# Patient Record
Sex: Male | Born: 1940 | Race: Black or African American | Hispanic: No | Smoking: Never smoker
Health system: Southern US, Community
[De-identification: ages and names within clinical notes are randomized; demographics above are authoritative.]

## PROBLEM LIST (undated history)

## (undated) DIAGNOSIS — E119 Type 2 diabetes mellitus without complications: Secondary | ICD-10-CM

## (undated) DIAGNOSIS — I10 Essential (primary) hypertension: Secondary | ICD-10-CM

## (undated) DIAGNOSIS — M1990 Unspecified osteoarthritis, unspecified site: Secondary | ICD-10-CM

## (undated) HISTORY — PX: COLONOSCOPY: SHX174

---

## 2004-09-28 ENCOUNTER — Emergency Department (HOSPITAL_COMMUNITY): Admission: EM | Admit: 2004-09-28 | Discharge: 2004-09-28 | Payer: Self-pay | Admitting: Emergency Medicine

## 2004-10-04 ENCOUNTER — Emergency Department (HOSPITAL_COMMUNITY): Admission: EM | Admit: 2004-10-04 | Discharge: 2004-10-05 | Payer: Self-pay | Admitting: Emergency Medicine

## 2007-06-30 ENCOUNTER — Emergency Department (HOSPITAL_COMMUNITY): Admission: EM | Admit: 2007-06-30 | Discharge: 2007-06-30 | Payer: Self-pay | Admitting: Emergency Medicine

## 2008-06-13 ENCOUNTER — Emergency Department (HOSPITAL_COMMUNITY): Admission: EM | Admit: 2008-06-13 | Discharge: 2008-06-13 | Payer: Self-pay | Admitting: Emergency Medicine

## 2008-06-21 ENCOUNTER — Encounter: Admission: RE | Admit: 2008-06-21 | Discharge: 2008-06-21 | Payer: Self-pay | Admitting: Chiropractic Medicine

## 2009-01-29 IMAGING — CR DG CHEST 2V
2 series · 2 of 2 positions shown · non-contrast
Comparison: 09/28/04.

CLINICAL DATA: Cough and cold symptoms.
 PA AND LATERAL CHEST - 2 VIEW:

[w chest pa]
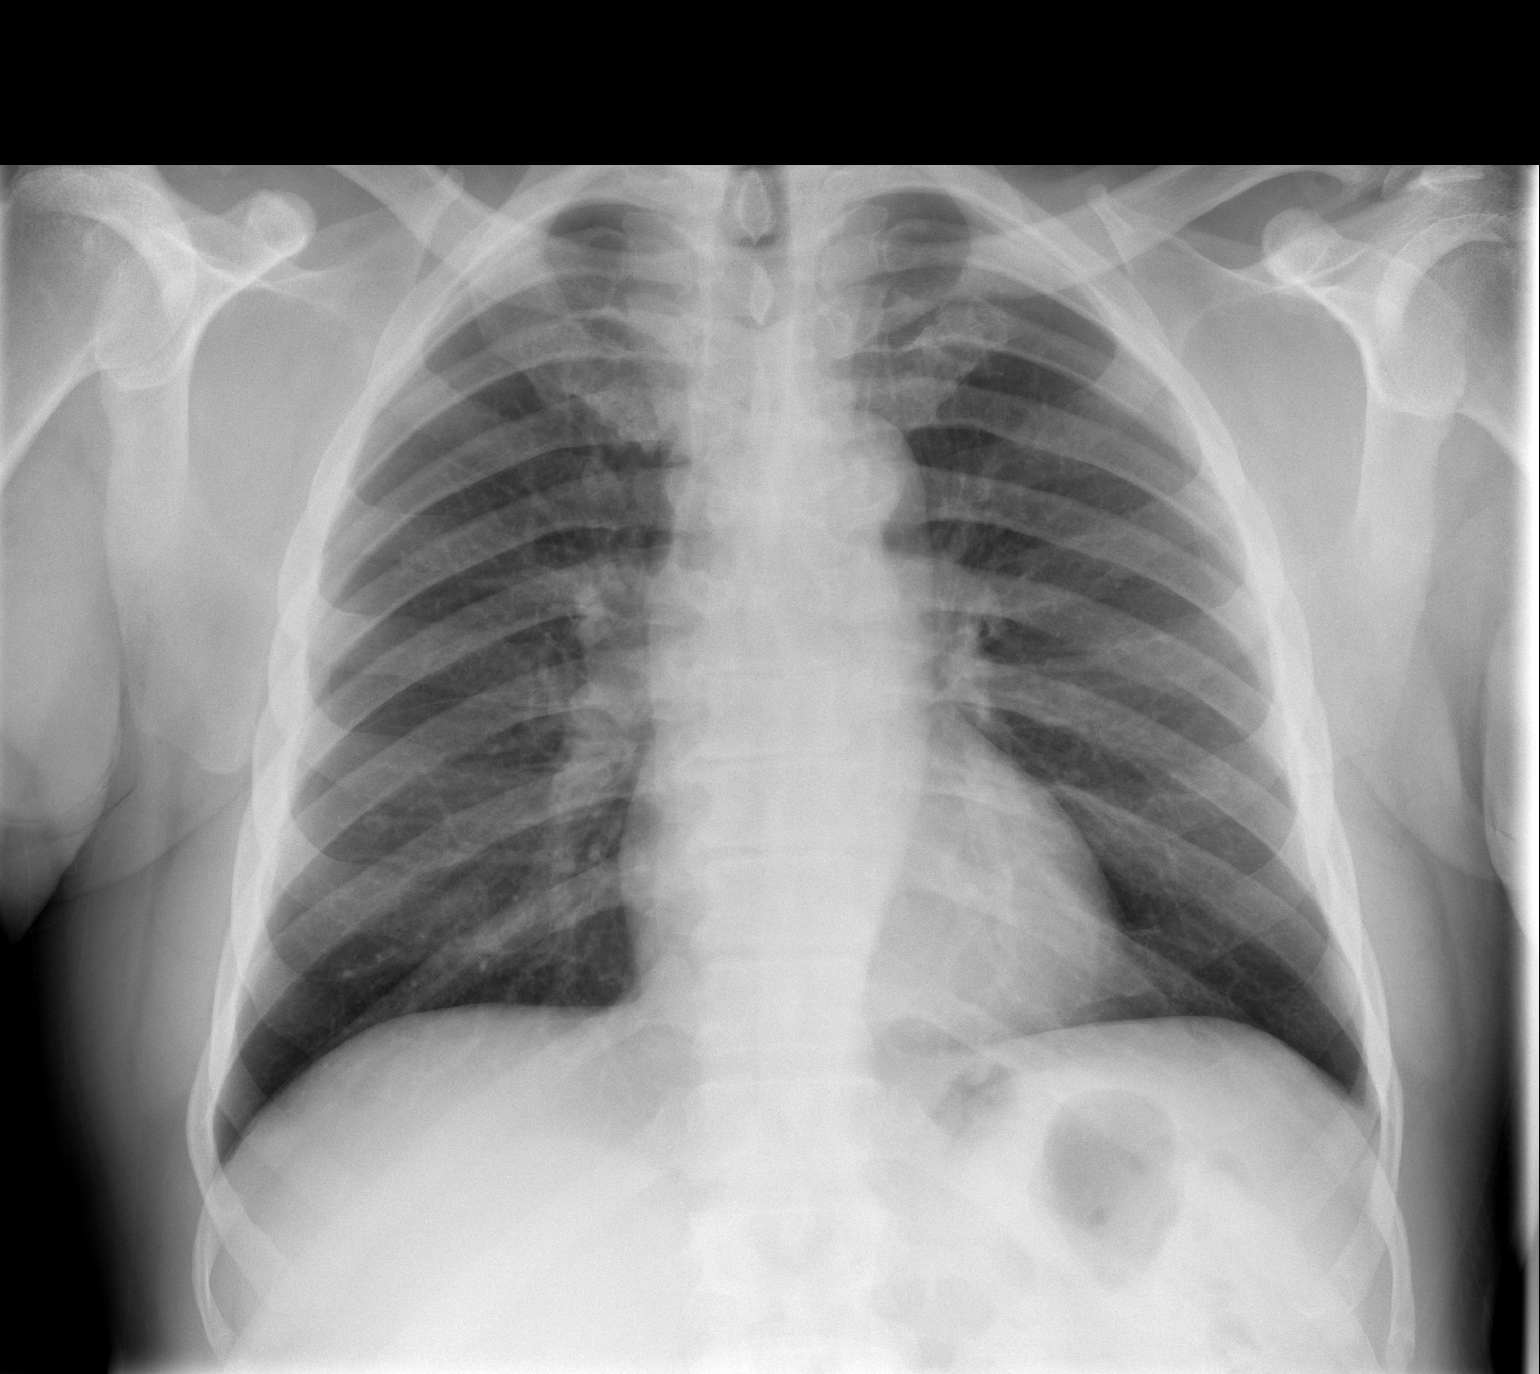

[w chest lat]
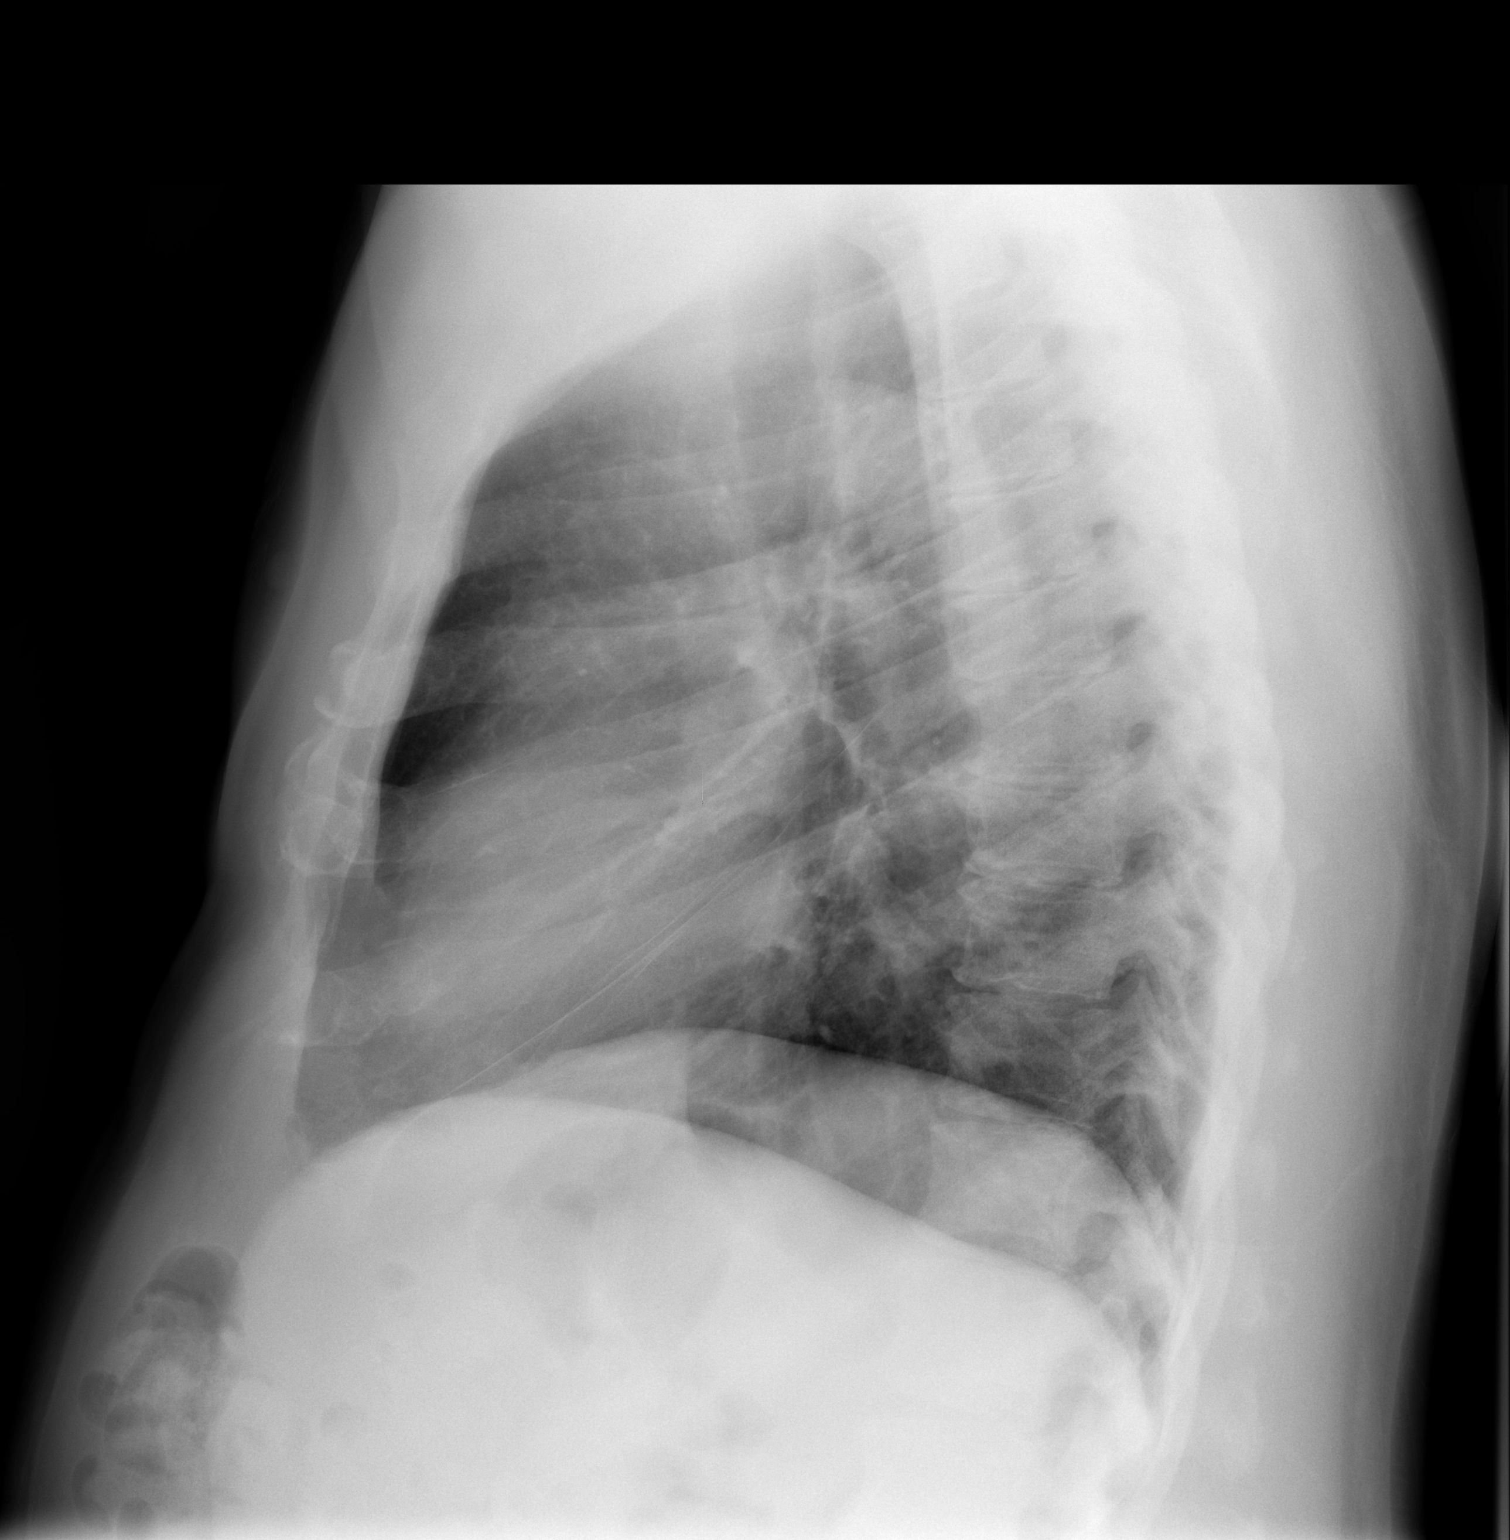

[2 of 2 positions shown; findings below may reference images not displayed]

FINDINGS: The lungs are clear.   Heart size is normal.   No effusion or focal bony abnormality.
IMPRESSION: No acute disease.

## 2010-01-13 IMAGING — CR DG SHOULDER 2+V*L*
3 series · 3 of 3 positions shown · non-contrast
Comparison: None

CLINICAL DATA: Left shoulder pain secondary to a fall 2 days ago.

LEFT SHOULDER - 2+ VIEW

[w shoulder ap internal left]
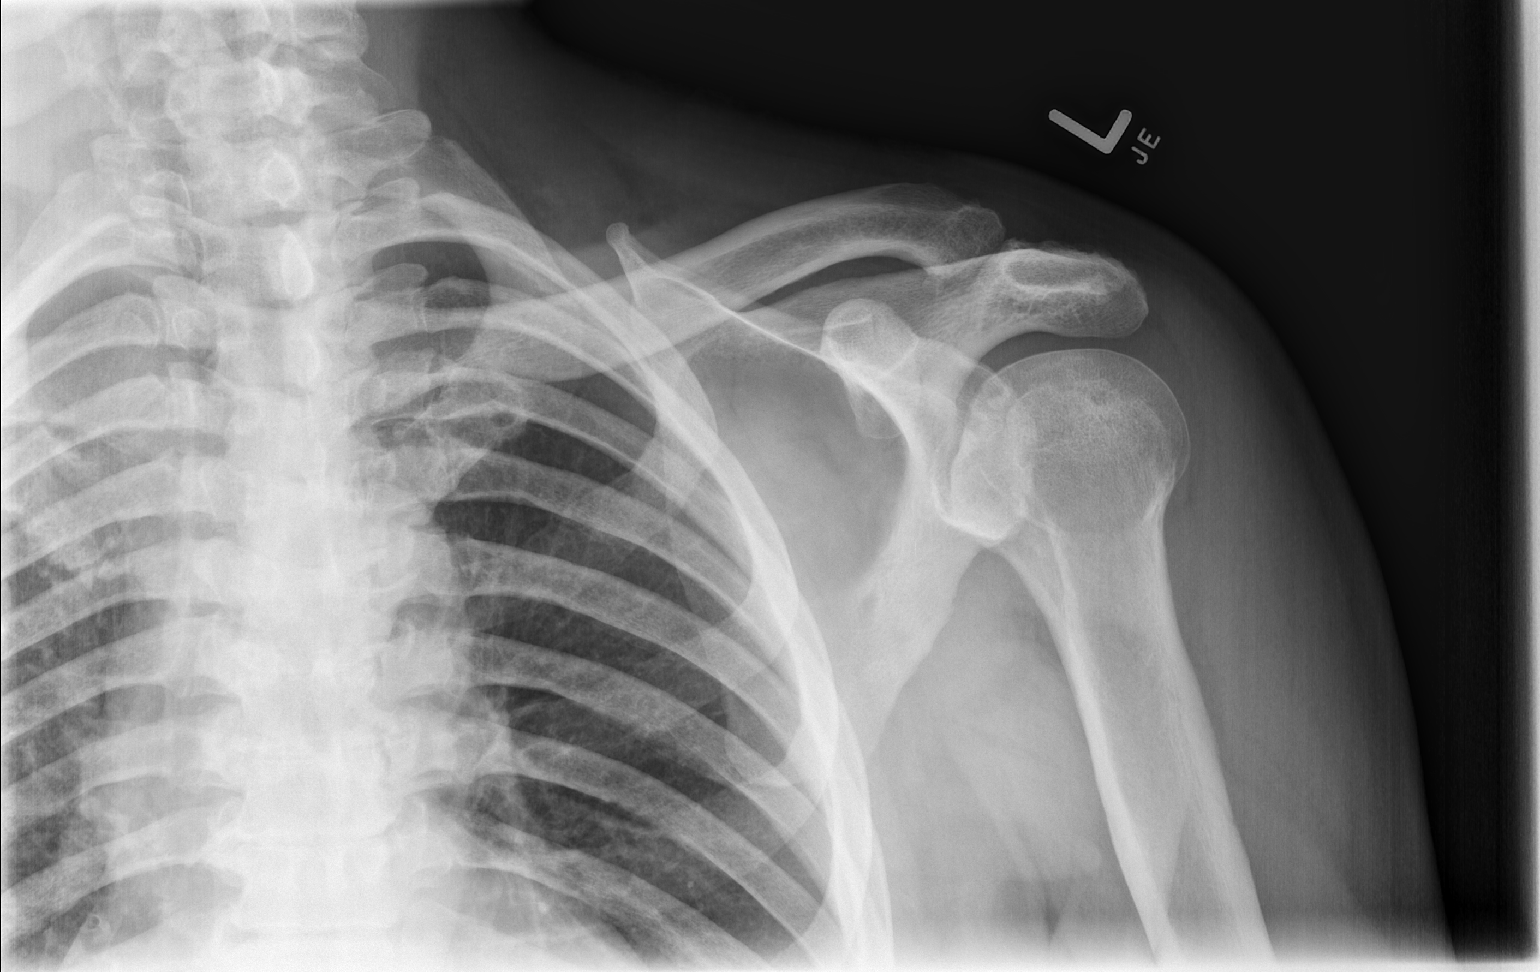

[w shoulder ap external left]
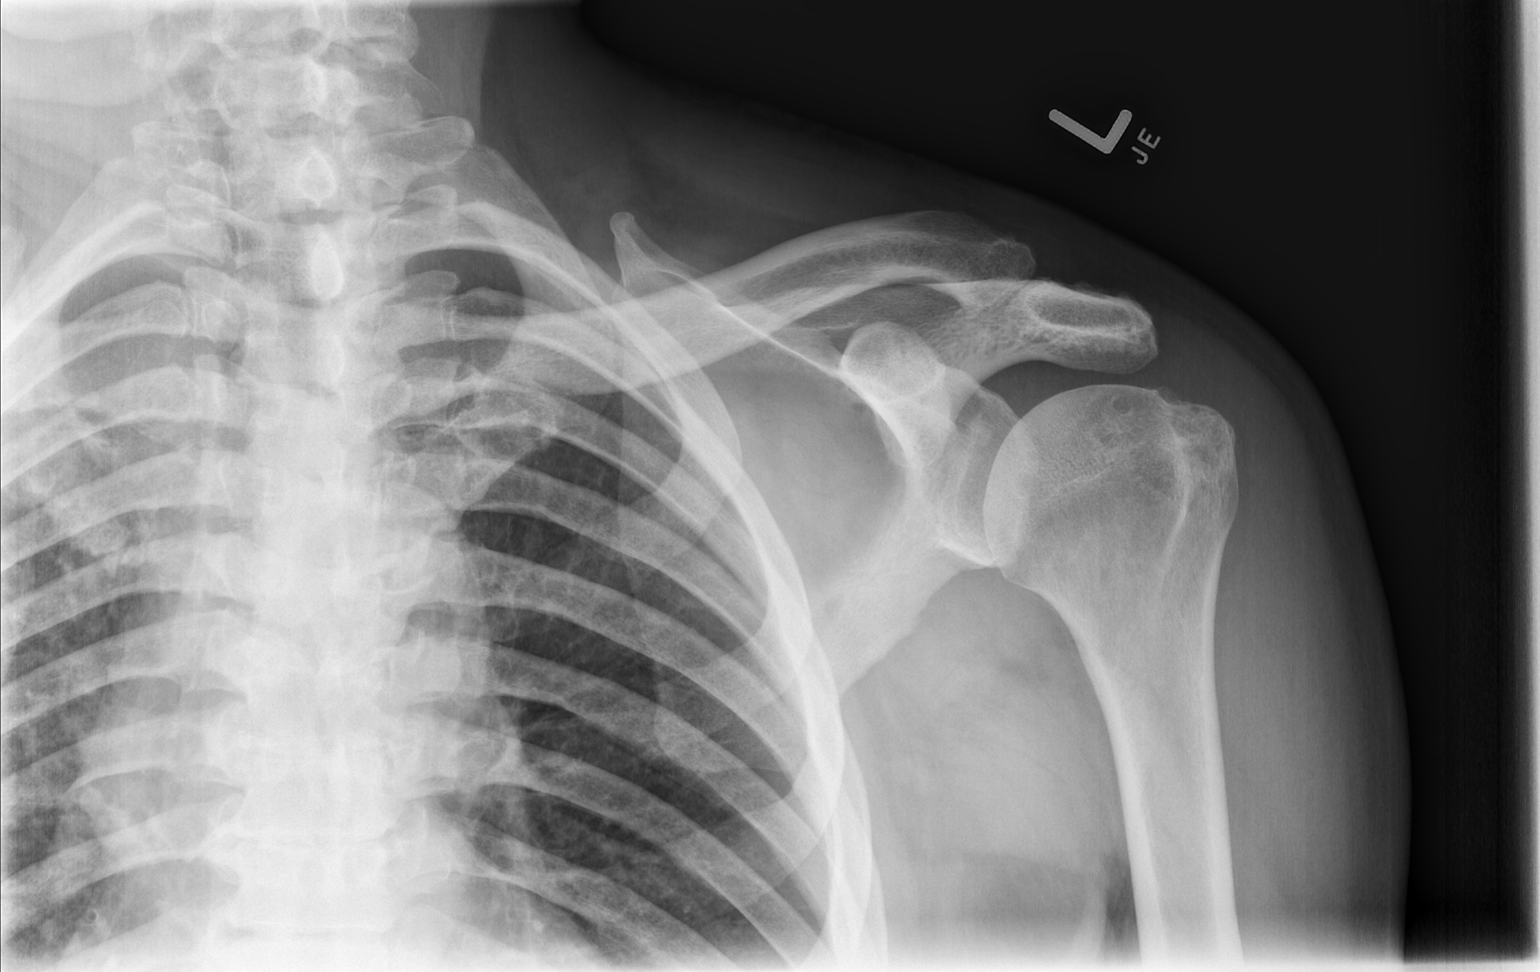

[x shoulder axillary left]
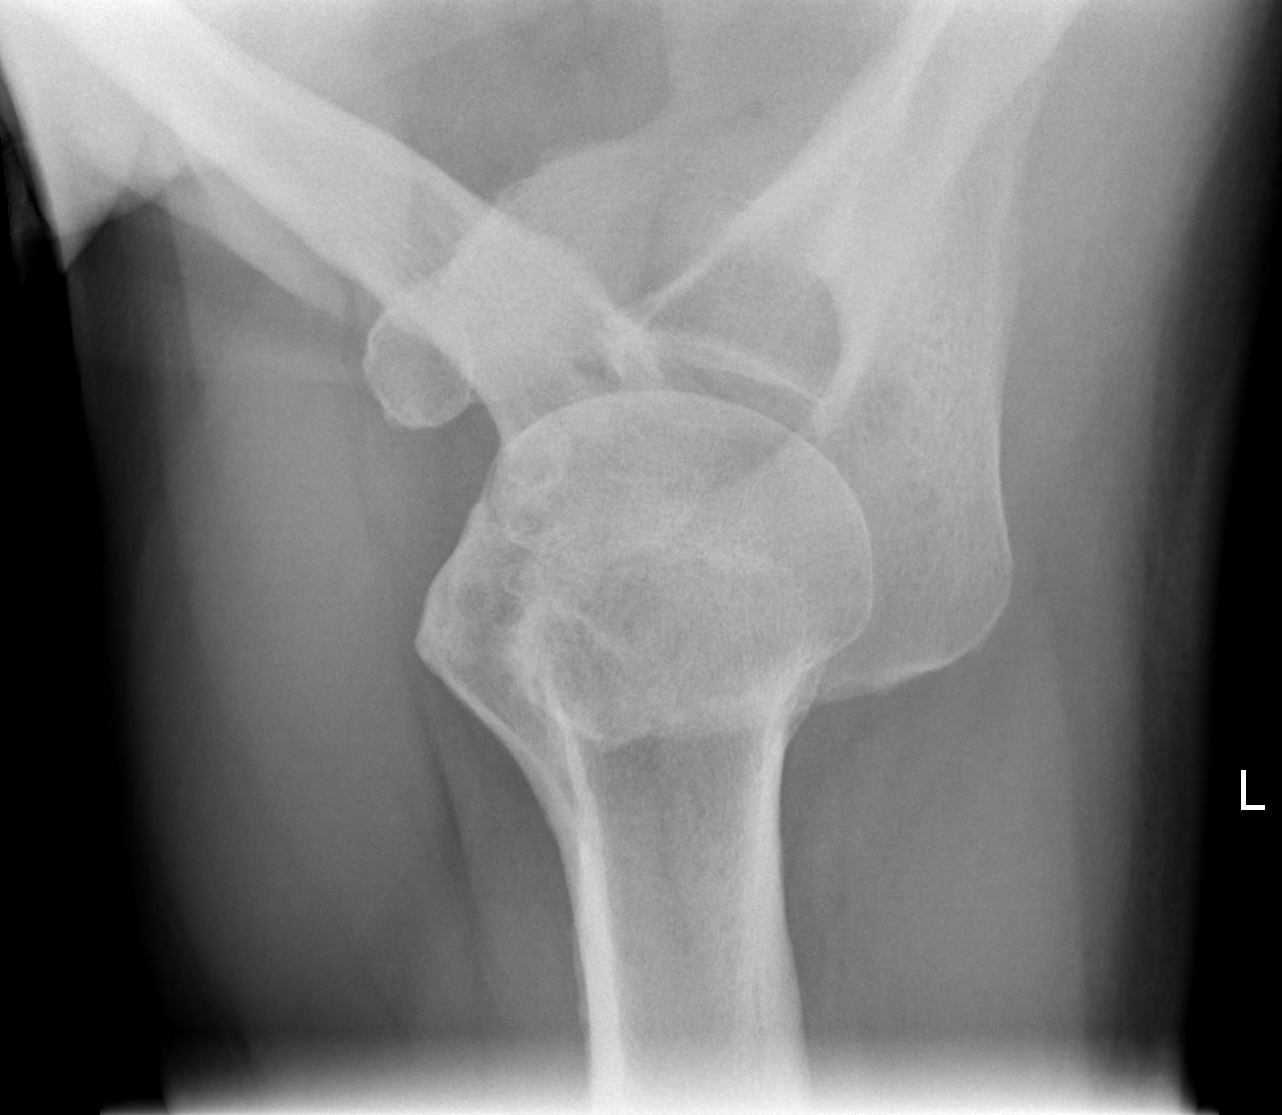

[3 of 3 positions shown; findings below may reference images not displayed]

FINDINGS: There are cystic degenerative changes of the humeral head
in the region of the greater tuberosity.  There are some
degenerative changes at the acromioclavicular joint as well.  There
is no fracture or dislocation or other acute abnormality.
IMPRESSION: Arthritic changes at the left shoulder joint.

## 2010-01-13 IMAGING — CR DG RIBS 2V*L*
4 series · 4 of 4 positions shown · non-contrast
Comparison: None

CLINICAL DATA: Left shoulder, left rib, and left hip pain secondary
to a fall 2 days ago.

LEFT RIBS - 2 VIEW

[w ribs ap/pa upper left]
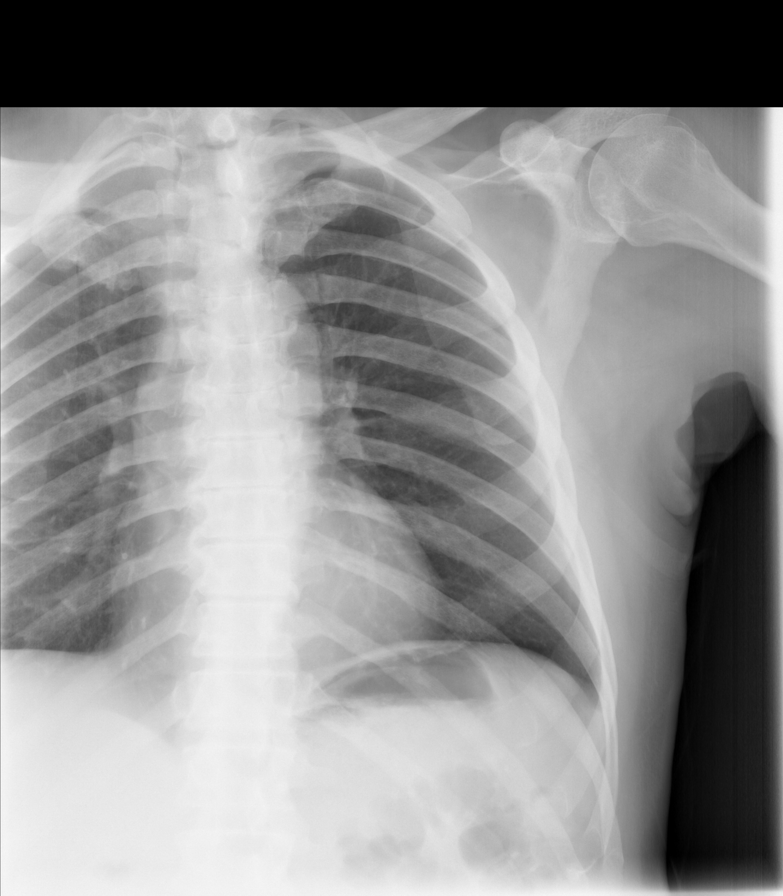

[w ribs ap/pa lower left]
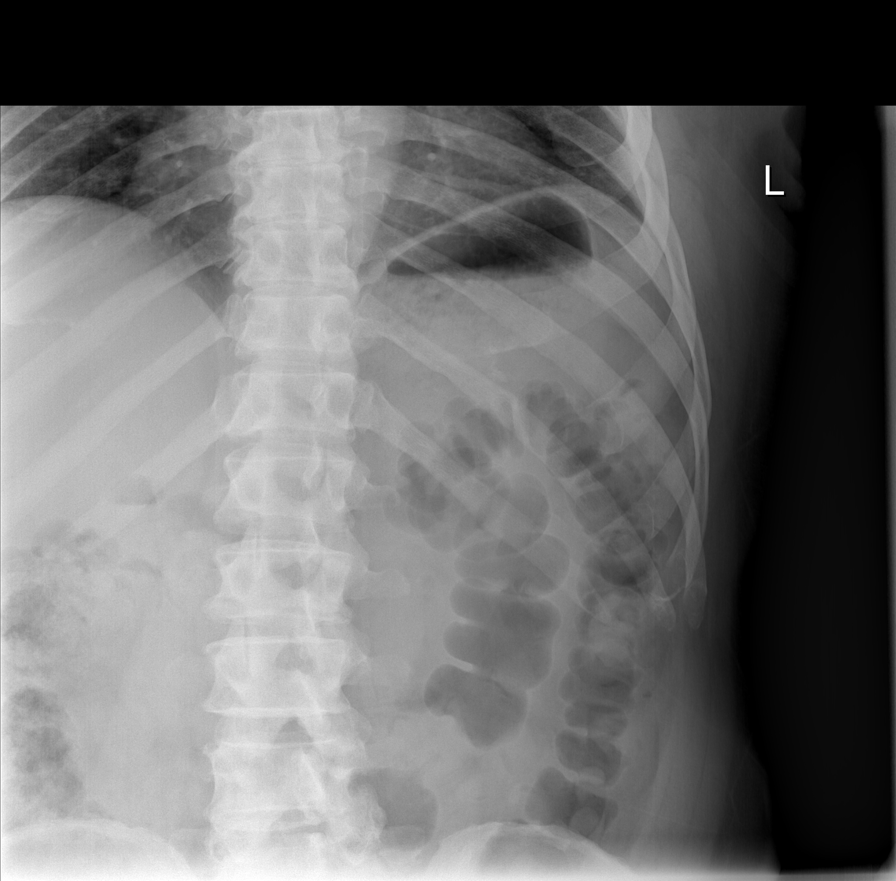

[w ribs oblique left (1 of 2)]
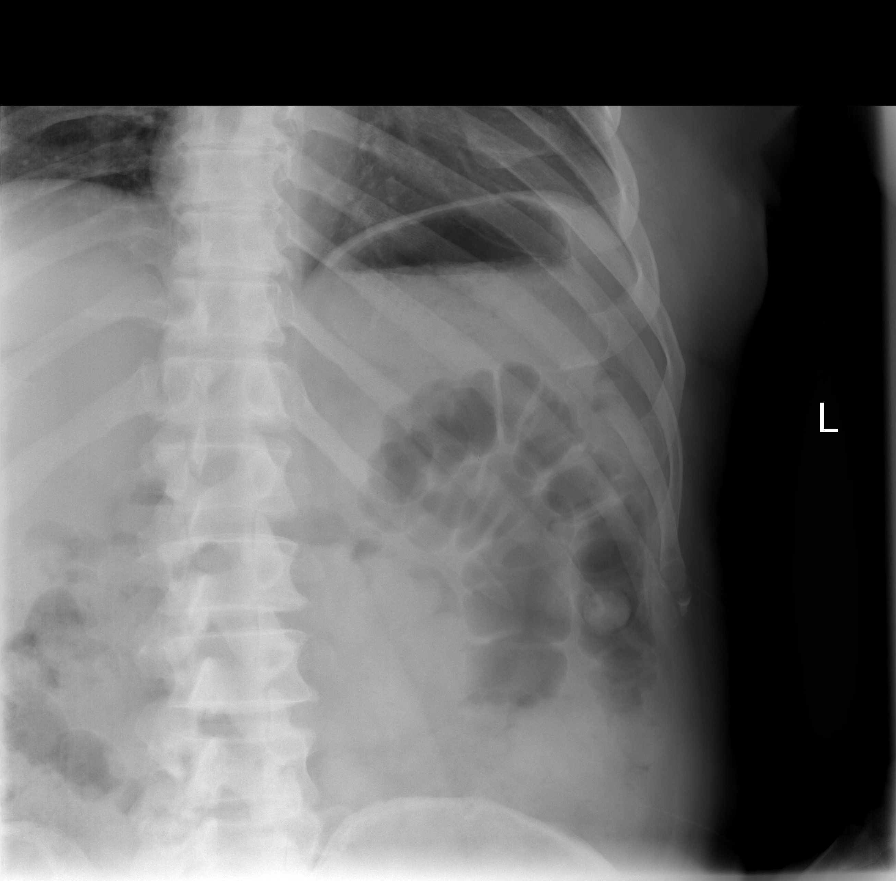

[w ribs oblique left (2 of 2)]
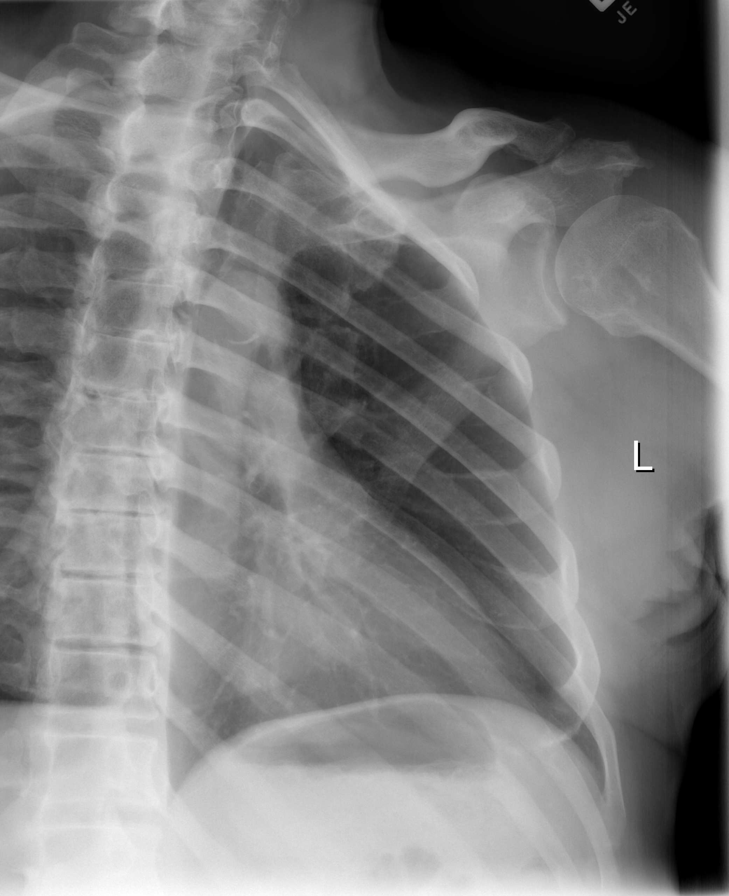

[4 of 4 positions shown; findings below may reference images not displayed]

FINDINGS: There is no fracture or other significant abnormality of
the left ribs.  There is no pneumothorax or lung contusion.
IMPRESSION: Normal left ribs.

## 2011-04-12 LAB — CBC
Hemoglobin: 13.8
MCHC: 34.9
MCV: 77.4 — ABNORMAL LOW
RDW: 14.1

## 2011-04-12 LAB — I-STAT 8, (EC8 V) (CONVERTED LAB)
Acid-Base Excess: 2
Bicarbonate: 26.8 — ABNORMAL HIGH
Glucose, Bld: 159 — ABNORMAL HIGH
Sodium: 139
TCO2: 28
pH, Ven: 7.411 — ABNORMAL HIGH

## 2011-04-12 LAB — DIFFERENTIAL
Basophils Absolute: 0
Basophils Relative: 0
Monocytes Relative: 12
Neutro Abs: 3.7

## 2011-04-12 LAB — POCT I-STAT CREATININE
Creatinine, Ser: 1.2
Operator id: 270651

## 2013-09-11 ENCOUNTER — Encounter (HOSPITAL_COMMUNITY): Payer: Self-pay | Admitting: Emergency Medicine

## 2013-09-11 ENCOUNTER — Emergency Department (HOSPITAL_COMMUNITY)
Admission: EM | Admit: 2013-09-11 | Discharge: 2013-09-11 | Disposition: A | Discharge status: Home / Self Care | Payer: Medicare Other | Attending: Emergency Medicine | Admitting: Emergency Medicine

## 2013-09-11 ENCOUNTER — Emergency Department (HOSPITAL_COMMUNITY): Payer: Medicare Other

## 2013-09-11 DIAGNOSIS — I10 Essential (primary) hypertension: Secondary | ICD-10-CM | POA: Insufficient documentation

## 2013-09-11 DIAGNOSIS — R109 Unspecified abdominal pain: Secondary | ICD-10-CM

## 2013-09-11 DIAGNOSIS — R1110 Vomiting, unspecified: Secondary | ICD-10-CM

## 2013-09-11 DIAGNOSIS — Z8739 Personal history of other diseases of the musculoskeletal system and connective tissue: Secondary | ICD-10-CM | POA: Insufficient documentation

## 2013-09-11 DIAGNOSIS — K5900 Constipation, unspecified: Secondary | ICD-10-CM

## 2013-09-11 DIAGNOSIS — E119 Type 2 diabetes mellitus without complications: Secondary | ICD-10-CM | POA: Insufficient documentation

## 2013-09-11 HISTORY — DX: Essential (primary) hypertension: I10

## 2013-09-11 HISTORY — DX: Unspecified osteoarthritis, unspecified site: M19.90

## 2013-09-11 HISTORY — DX: Type 2 diabetes mellitus without complications: E11.9

## 2013-09-11 LAB — CBC WITH DIFFERENTIAL/PLATELET
BASOS PCT: 0 % (ref 0–1)
Basophils Absolute: 0 10*3/uL (ref 0.0–0.1)
EOS ABS: 0 10*3/uL (ref 0.0–0.7)
EOS PCT: 0 % (ref 0–5)
HEMATOCRIT: 41.1 % (ref 39.0–52.0)
HEMOGLOBIN: 14 g/dL (ref 13.0–17.0)
LYMPHS PCT: 7 % — AB (ref 12–46)
Lymphs Abs: 0.6 10*3/uL — ABNORMAL LOW (ref 0.7–4.0)
MCH: 26.5 pg (ref 26.0–34.0)
MCHC: 34.1 g/dL (ref 30.0–36.0)
MCV: 77.8 fL — ABNORMAL LOW (ref 78.0–100.0)
MONO ABS: 0.2 10*3/uL (ref 0.1–1.0)
MONOS PCT: 2 % — AB (ref 3–12)
NEUTROS ABS: 7.4 10*3/uL (ref 1.7–7.7)
Neutrophils Relative %: 90 % — ABNORMAL HIGH (ref 43–77)
Platelets: 195 10*3/uL (ref 150–400)
RBC: 5.28 MIL/uL (ref 4.22–5.81)
RDW: 13.7 % (ref 11.5–15.5)
WBC: 8.2 10*3/uL (ref 4.0–10.5)

## 2013-09-11 LAB — URINALYSIS, ROUTINE W REFLEX MICROSCOPIC
Bilirubin Urine: NEGATIVE
GLUCOSE, UA: NEGATIVE mg/dL
Hgb urine dipstick: NEGATIVE
KETONES UR: 15 mg/dL — AB
LEUKOCYTES UA: NEGATIVE
NITRITE: NEGATIVE
PH: 5.5 (ref 5.0–8.0)
PROTEIN: NEGATIVE mg/dL
Specific Gravity, Urine: 1.033 — ABNORMAL HIGH (ref 1.005–1.030)
Urobilinogen, UA: 0.2 mg/dL (ref 0.0–1.0)

## 2013-09-11 LAB — COMPREHENSIVE METABOLIC PANEL
ALT: 12 U/L (ref 0–53)
AST: 14 U/L (ref 0–37)
Albumin: 4.3 g/dL (ref 3.5–5.2)
Alkaline Phosphatase: 92 U/L (ref 39–117)
BILIRUBIN TOTAL: 0.3 mg/dL (ref 0.3–1.2)
BUN: 11 mg/dL (ref 6–23)
CHLORIDE: 102 meq/L (ref 96–112)
CO2: 27 meq/L (ref 19–32)
CREATININE: 1.15 mg/dL (ref 0.50–1.35)
Calcium: 9.6 mg/dL (ref 8.4–10.5)
GFR calc Af Amer: 71 mL/min — ABNORMAL LOW (ref >90)
GFR, EST NON AFRICAN AMERICAN: 61 mL/min — AB (ref >90)
Glucose, Bld: 176 mg/dL — ABNORMAL HIGH (ref 70–99)
Potassium: 4.1 mEq/L (ref 3.7–5.3)
SODIUM: 142 meq/L (ref 137–147)
Total Protein: 7.9 g/dL (ref 6.0–8.3)

## 2013-09-11 LAB — LIPASE, BLOOD: Lipase: 50 U/L (ref 11–59)

## 2013-09-11 MED ORDER — SODIUM CHLORIDE 0.9 % IV BOLUS (SEPSIS)
1000.0000 mL | Freq: Once | INTRAVENOUS | Status: AC
  Administered 2013-09-11: 1000 mL via INTRAVENOUS

## 2013-09-11 MED ORDER — IOHEXOL 300 MG/ML  SOLN
100.0000 mL | Freq: Once | INTRAMUSCULAR | Status: AC | PRN
  Administered 2013-09-11: 100 mL via INTRAVENOUS

## 2013-09-11 MED ORDER — IOHEXOL 300 MG/ML  SOLN
25.0000 mL | INTRAMUSCULAR | Status: AC | PRN
  Administered 2013-09-11 (×2): 25 mL via ORAL

## 2013-09-11 MED ORDER — ONDANSETRON HCL 4 MG/2ML IJ SOLN
4.0000 mg | Freq: Once | INTRAMUSCULAR | Status: AC
  Administered 2013-09-11: 4 mg via INTRAVENOUS
  Filled 2013-09-11: qty 2

## 2013-09-11 MED ORDER — OXYCODONE-ACETAMINOPHEN 5-325 MG PO TABS: 1.0000 | ORAL_TABLET | ORAL | Status: DC | PRN

## 2013-09-11 MED ORDER — DOCUSATE SODIUM 100 MG PO CAPS: 100.0000 mg | ORAL_CAPSULE | Freq: Every day | ORAL | Status: DC

## 2013-09-11 MED ORDER — HYDROMORPHONE HCL PF 1 MG/ML IJ SOLN
0.5000 mg | Freq: Once | INTRAMUSCULAR | Status: AC
  Administered 2013-09-11: 0.5 mg via INTRAVENOUS
  Filled 2013-09-11: qty 1

## 2013-09-11 NOTE — ED Notes (Signed)
Pt presents to department for evaluation of diffuse abdominal pain, N/V and constipation. Ongoing x1 day. Denies urinary symptoms. Pt states 8/10 abdominal pain. Pt is alert and oriented x4.

## 2013-09-11 NOTE — ED Provider Notes (Signed)
CSN: [id number]     Arrival date & time 09/11/13  1212 History   First MD Initiated Contact with Patient 09/11/13 1221     Chief Complaint  Patient presents with  . Abdominal Pain  . Emesis     (Consider location/radiation/quality/duration/timing/severity/associated sxs/prior Treatment) Patient is a 73 y.o. male presenting with abdominal pain and vomiting. The history is provided by the patient.  Abdominal Pain Pain location: upper abdomen. Pain quality: aching   Pain radiates to:  Does not radiate Pain severity:  Moderate Onset quality:  Sudden Duration:  10 hours Timing:  Constant Progression:  Worsening Chronicity:  New Context comment:  Ate ice cream and cupcakes after dinner last night Relieved by:  Nothing Worsened by:  Nothing tried Ineffective treatments:  None tried Associated symptoms: vomiting   Associated symptoms: no chest pain, no cough, no diarrhea, no dysuria, no fever, no hematuria, no nausea and no shortness of breath   Vomiting:    Quality:  Stomach contents Emesis Associated symptoms: abdominal pain   Associated symptoms: no diarrhea and no headaches     Past Medical History  Diagnosis Date  . Hypertension   . Diabetes mellitus without complication   . Arthritis    History reviewed. No pertinent past surgical history. No family history on file. History  Substance Use Topics  . Smoking status: Never Smoker   . Smokeless tobacco: Not on file  . Alcohol Use: No    Review of Systems  Constitutional: Negative for fever.  HENT: Negative for drooling and rhinorrhea.   Eyes: Negative for pain.  Respiratory: Negative for cough and shortness of breath.   Cardiovascular: Negative for chest pain and leg swelling.  Gastrointestinal: Positive for vomiting and abdominal pain. Negative for nausea and diarrhea.  Genitourinary: Negative for dysuria and hematuria.  Musculoskeletal: Negative for gait problem and neck pain.  Skin: Negative for color change.   Neurological: Negative for numbness and headaches.  Hematological: Negative for adenopathy.  Psychiatric/Behavioral: Negative for behavioral problems.  All other systems reviewed and are negative.      Allergies  Review of patient's allergies indicates no known allergies.  Home Medications  No current outpatient prescriptions on file. BP 189/76  Pulse 63  Temp(Src) 97.6 F (36.4 C) (Oral)  Resp 18  SpO2 96% Physical Exam  Nursing note and vitals reviewed. Constitutional: He is oriented to person, place, and time. He appears well-developed and well-nourished.  HENT:  Head: Normocephalic and atraumatic.  Right Ear: External ear normal.  Left Ear: External ear normal.  Nose: Nose normal.  Mouth/Throat: Oropharynx is clear and moist. No oropharyngeal exudate.  Eyes: Conjunctivae and EOM are normal. Pupils are equal, round, and reactive to light.  Neck: Normal range of motion. Neck supple.  Cardiovascular: Normal rate, regular rhythm, normal heart sounds and intact distal pulses.  Exam reveals no gallop and no friction rub.   No murmur heard. Pulmonary/Chest: Effort normal and breath sounds normal. No respiratory distress. He has no wheezes.  Abdominal: Soft. Bowel sounds are normal. He exhibits no distension. There is tenderness. There is no rebound and no guarding.  Musculoskeletal: Normal range of motion. He exhibits no edema and no tenderness.  Neurological: He is alert and oriented to person, place, and time.  Skin: Skin is warm and dry. Erythema: mild tenderness to palpation of the upper abdomen which seems to be worse in the right upper quadrant and epigastric area.  Psychiatric: He has a normal mood and affect. His  behavior is normal.    ED Course  Procedures (including critical care time) Labs Review Labs Reviewed  CBC WITH DIFFERENTIAL - Abnormal; Notable for the following:    MCV 77.8 (*)    Neutrophils Relative % 90 (*)    Lymphocytes Relative 7 (*)    Lymphs  Abs 0.6 (*)    Monocytes Relative 2 (*)    All other components within normal limits  COMPREHENSIVE METABOLIC PANEL - Abnormal; Notable for the following:    Glucose, Bld 176 (*)    GFR calc non Af Amer 61 (*)    GFR calc Af Amer 71 (*)    All other components within normal limits  URINALYSIS, ROUTINE W REFLEX MICROSCOPIC - Abnormal; Notable for the following:    Specific Gravity, Urine 1.033 (*)    Ketones, ur 15 (*)    All other components within normal limits  LIPASE, BLOOD   Imaging Review Ct Abdomen Pelvis W Contrast  09/11/2013   CLINICAL DATA:  Diffuse abdominal pain, nausea vomiting and constipation for the preceding 24 hr  EXAM: CT ABDOMEN AND PELVIS WITH CONTRAST  TECHNIQUE: Multidetector CT imaging of the abdomen and pelvis was performed using the standard protocol following bolus administration of intravenous contrast.  CONTRAST:  OMNIPAQUE IOHEXOL 300 MG/ML SOLN semi: The patient also received oral contrast material  COMPARISON:  None.  FINDINGS: The liver exhibits no focal mass nor ductal dilation. The gallbladder is mildly distended and exhibits no evidence of stones or wall thickening or pericholecystic fluid. The pancreas, spleen, adrenal glands, and kidneys are normal in appearance. The caliber of the abdominal aorta is normal. The psoas musculature is normal in appearance. The partially distended urinary bladder exhibits a prominent impression upon its base from the adjacent enlarged prostate gland. No bladder stones or other abnormalities are demonstrated. On delayed images contrast within the renal collecting systems and proximal ureters is normal.  The stomach is moderately distended with the orally administered contrast. There is a hiatal hernia with thickened walls which may reflect inflammation. The duodenum is partially gas-filled. No ulcer is demonstrated. The partially contrast filled loops of small bowel exhibit no evidence of enteritis or obstruction or ileus. The  terminal ileum is normal in appearance. A normal calibered, uninflamed-appearing appendix is demonstrated on images 48 through 60. There is a moderate stool burden within the ascending and transverse portions of the colon. The descending colon and rectosigmoid demonstrate less stool volume. There is no evidence of colitis or diverticulitis. There is no inguinal nor significant umbilical hernia.  The lumbar vertebral bodies are preserved in height. The bony pelvis exhibits degenerative changes of the hips especially on the right but no acute abnormality. The lung bases exhibit minimal compressive atelectasis.  IMPRESSION: 1. There is thickening walls of the small hiatal hernial which may reflect esophagitis. The stomach and duodenum exhibit no acute abnormalities. 2. The remainder of the small bowel as well as the large bowel exhibit no acute abnormalities. There is a moderate stool burden in the right colon and in the transverse colon. No obstruction is demonstrated. 3. There is mild distention of the gallbladder which may reflect the patient's presumed fasting state but correlation with the patient's clinical exam and laboratory values is needed. Gallbladder ultrasound may be useful. 4. There is no evidence of acute urinary tract abnormality. There is prostatic enlargement.   Electronically Signed   By: David  [place]   On: 09/11/2013 14:00   US Abdomen Limited Ruq  09/11/2013   CLINICAL DATA:  Right upper quadrant pain.  EXAM: US ABDOMEN LIMITED - RIGHT UPPER QUADRANT  COMPARISON:  CT ABD/PELVIS W CM dated 09/11/2013  FINDINGS: Gallbladder:  Gallbladder is mildly distended but no evidence for gallstones, sludge or wall thickening.  Common bile duct:  Diameter: 0.5 cm.  Liver:  Normal appearance of the liver without focal abnormality. Main portal vein is patent.  IMPRESSION: Normal right upper quadrant ultrasound.   Electronically Signed   By: Richarda OverlieAdam  Henn M.D.   On: 09/11/2013 15:56     EKG Interpretation None       MDM   Final diagnoses:  Constipation  Abdominal pain  Vomiting    12:28 PM 73 y.o. male who presents with abdominal pain which began this morning approximately 1 AM. He states that he ate cupcakes and ice cream after dinner last night. He woke up early this morning with diffuse abdominal pain and had 2 episodes of emesis. He denies any fevers or diarrhea. He has not had a bowel movement since his symptoms started. He is afebrile and vital signs are unremarkable with the exception of mild hypertension. Will get screening labwork, pain control, and CT scan.  4:14 PM: Labs non-contrib. CT w/ mild distension of GB. Got US which was neg for acute findings. Pt's pain controlled, he continues to appear well.   I have discussed the diagnosis/risks/treatment options with the patient and believe the pt to be eligible for discharge home to follow-up with pcp as needed. We also discussed returning to the ED immediately if new or worsening sx occur. We discussed the sx which are most concerning (e.g., worsening pain, fever, inc vomiting) that necessitate immediate return. Medications administered to the patient during their visit and any new prescriptions provided to the patient are listed below.  Medications given during this visit Medications  sodium chloride 0.9 % bolus 1,000 mL (0 mLs Intravenous Stopped 09/11/13 1513)  HYDROmorphone (DILAUDID) injection 0.5 mg (0.5 mg Intravenous Given 09/11/13 1254)  ondansetron (ZOFRAN) injection 4 mg (4 mg Intravenous Given 09/11/13 1252)  iohexol (OMNIPAQUE) 300 MG/ML solution 25 mL (25 mLs Oral Contrast Given 09/11/13 1300)  iohexol (OMNIPAQUE) 300 MG/ML solution 100 mL (100 mLs Intravenous Contrast Given 09/11/13 1336)    New Prescriptions   DOCUSATE SODIUM (COLACE) 100 MG CAPSULE    Take 1 capsule (100 mg total) by mouth daily.   OXYCODONE-ACETAMINOPHEN (PERCOCET) 5-325 MG PER TABLET    Take 1 tablet by mouth every 4 (four) hours as needed.     Junius ArgyleForrest S  Adarsh Mundorf, MD 09/11/13 [phone]

## 2013-09-11 NOTE — Discharge Instructions (Signed)
Abdominal Pain, Adult °Many things can cause abdominal pain. Usually, abdominal pain is not caused by a disease and will improve without treatment. It can often be observed and treated at home. Your health care provider will do a physical exam and possibly order blood tests and X-rays to help determine the seriousness of your pain. However, in many cases, more time must pass before a clear cause of the pain can be found. Before that point, your health care provider may not know if you need more testing or further treatment. °HOME CARE INSTRUCTIONS  °Monitor your abdominal pain for any changes. The following actions may help to alleviate any discomfort you are experiencing: °· Only take over-the-counter or prescription medicines as directed by your health care provider. °· Do not take laxatives unless directed to do so by your health care provider. °· Try a clear liquid diet (broth, tea, or water) as directed by your health care provider. Slowly move to a bland diet as tolerated. °SEEK MEDICAL CARE IF: °· You have unexplained abdominal pain. °· You have abdominal pain associated with nausea or diarrhea. °· You have pain when you urinate or have a bowel movement. °· You experience abdominal pain that wakes you in the night. °· You have abdominal pain that is worsened or improved by eating food. °· You have abdominal pain that is worsened with eating fatty foods. °SEEK IMMEDIATE MEDICAL CARE IF:  °· Your pain does not go away within 2 hours. °· You have a fever. °· You keep throwing up (vomiting). °· Your pain is felt only in portions of the abdomen, such as the right side or the left lower portion of the abdomen. °· You pass bloody or black tarry stools. °MAKE SURE YOU: °· Understand these instructions.   °· Will watch your condition.   °· Will get help right away if you are not doing well or get worse.   °Document Released: 04/03/2005 Document Revised: 04/14/2013 Document Reviewed: 03/03/2013 °Exit[employer]® Patient  Information ©2014 [employer]. ° °

## 2013-09-11 NOTE — ED Notes (Signed)
Patient transported to CT 

## 2014-09-16 ENCOUNTER — Encounter: Payer: Self-pay | Admitting: Podiatry

## 2014-09-16 ENCOUNTER — Ambulatory Visit (INDEPENDENT_AMBULATORY_CARE_PROVIDER_SITE_OTHER): Payer: Medicare Other

## 2014-09-16 ENCOUNTER — Ambulatory Visit (INDEPENDENT_AMBULATORY_CARE_PROVIDER_SITE_OTHER): Payer: Medicare Other | Admitting: Podiatry

## 2014-09-16 VITALS — BP 116/62 | HR 70 | Resp 12

## 2014-09-16 DIAGNOSIS — M2040 Other hammer toe(s) (acquired), unspecified foot: Secondary | ICD-10-CM | POA: Diagnosis not present

## 2014-09-16 DIAGNOSIS — M79676 Pain in unspecified toe(s): Secondary | ICD-10-CM

## 2014-09-16 DIAGNOSIS — E1140 Type 2 diabetes mellitus with diabetic neuropathy, unspecified: Secondary | ICD-10-CM | POA: Diagnosis not present

## 2014-09-16 DIAGNOSIS — M79672 Pain in left foot: Secondary | ICD-10-CM

## 2014-09-16 DIAGNOSIS — B351 Tinea unguium: Secondary | ICD-10-CM | POA: Diagnosis not present

## 2014-09-16 DIAGNOSIS — E1149 Type 2 diabetes mellitus with other diabetic neurological complication: Secondary | ICD-10-CM

## 2014-09-16 NOTE — Patient Instructions (Signed)
Diabetes and Foot Care Diabetes may cause you to have problems because of poor blood supply (circulation) to your feet and legs. This may cause the skin on your feet to become thinner, break easier, and heal more slowly. Your skin may become dry, and the skin may peel and crack. You may also have nerve damage in your legs and feet causing decreased feeling in them. You may not notice minor injuries to your feet that could lead to infections or more serious problems. Taking care of your feet is one of the most important things you can do for yourself.  HOME CARE INSTRUCTIONS  Wear shoes at all times, even in the house. Do not go barefoot. Bare feet are easily injured.  Check your feet daily for blisters, cuts, and redness. If you cannot see the bottom of your feet, use a mirror or ask someone for help.  Wash your feet with warm water (do not use hot water) and mild soap. Then pat your feet and the areas between your toes until they are completely dry. Do not soak your feet as this can dry your skin.  Apply a moisturizing lotion or petroleum jelly (that does not contain alcohol and is unscented) to the skin on your feet and to dry, brittle toenails. Do not apply lotion between your toes.  Trim your toenails straight across. Do not dig under them or around the cuticle. File the edges of your nails with an emery board or nail file.  Do not cut corns or calluses or try to remove them with medicine.  Wear clean socks or stockings every day. Make sure they are not too tight. Do not wear knee-high stockings since they may decrease blood flow to your legs.  Wear shoes that fit properly and have enough cushioning. To break in new shoes, wear them for just a few hours a day. This prevents you from injuring your feet. Always look in your shoes before you put them on to be sure there are no objects inside.  Do not cross your legs. This may decrease the blood flow to your feet.  If you find a minor scrape,  cut, or break in the skin on your feet, keep it and the skin around it clean and dry. These areas may be cleansed with mild soap and water. Do not cleanse the area with peroxide, alcohol, or iodine.  When you remove an adhesive bandage, be sure not to damage the skin around it.  If you have a wound, look at it several times a day to make sure it is healing.  Do not use heating pads or hot water bottles. They may burn your skin. If you have lost feeling in your feet or legs, you may not know it is happening until it is too late.  Make sure your health care provider performs a complete foot exam at least annually or more often if you have foot problems. Report any cuts, sores, or bruises to your health care provider immediately. SEEK MEDICAL CARE IF:   You have an injury that is not healing.  You have cuts or breaks in the skin.  You have an ingrown nail.  You notice redness on your legs or feet.  You feel burning or tingling in your legs or feet.  You have pain or cramps in your legs and feet.  Your legs or feet are numb.  Your feet always feel cold. SEEK IMMEDIATE MEDICAL CARE IF:   There is increasing redness,   swelling, or pain in or around a wound.  There is a red line that goes up your leg.  Pus is coming from a wound.  You develop a fever or as directed by your health care provider.  You notice a bad smell coming from an ulcer or wound. Document Released: 06/21/2000 Document Revised: 02/24/2013 Document Reviewed: 12/01/2012 ExitCare Patient Information 2015 [employer]. This information is not intended to replace advice given to you by your health care provider. Make sure you discuss any questions you have with your health care provider.  

## 2014-09-16 NOTE — Progress Notes (Signed)
   Subjective:    Patient ID: Gabriel Harrison, male    DOB: 12/12/1940, 74 y.o.   MRN: [id number]  HPI  74 year old male presents the office today with complaints of his foot flattening over time and arch pain on the left foot. He denies any history of injury or trauma to the area and he denies any recent swelling or redness overlying the area. He states he has pain in the arch area of his left foot particularly with prolonged weightbearing and standing. He has gone to the shoe market previously and purges shoes without much relief of symptoms. He states the pain is intermittent and not on daily basis. The pain does not wake him up at night. He has diabetic for the last 15 years. He denies any history of ulceration, tingling or numbness or any intermittent claudication symptoms. No other complaints at this time.  Review of Systems  Musculoskeletal: Positive for gait problem.  All other systems reviewed and are negative.      Objective:   Physical Exam AAO 3, NAD DP/PT pulses 1/4 bilaterally, CRT less than 3 seconds Chronic bilateral lower extremity edema is present. Decreased protective sensation with Dorann OuSimms Weinstein monofilament, decreased vibratory sensation, Achilles tendon reflex intact. There is a decrease in medial arch height upon weightbearing bilaterally with equinus present. There are hammertoe contractures bilaterally. There is no specific area pinpoint bony tenderness or pain with vibratory sensation. There is no overlying edema, erythema, increase in warmth bilaterally. MMT 5/5, ROM WNL Nails hypertrophic, dystrophic, discolored, elongated 10. There is subjective tenderness on palpation of the nails 1-5 bilaterally. There is no swelling erythema or drainage on the nail sites. No open lesions identified bilaterally. No pain with calf compression, swelling, warmth, erythema.     Assessment & Plan:  74 year old male presents with symptomatic flatfoot, diabetic polyneuropathy,  symptomatic onychomycosis -Treatment options were discussed the patient including alternatives, risks, complications. -X-rays were obtained and reviewed with the patient. -Given the patient's foot type and neuropathy he would likely benefit from diabetic shoes/insert. I completed paperwork for precertification of the shoes. -Nail sharply debrided 10 without complication/bleeding -Follow-up in 3 months. Also follow-up once the diabetic shoes are approved. In the meantime encouraged to call the office with any questions, concerns, change in symptoms.

## 2014-09-20 ENCOUNTER — Telehealth: Payer: Self-pay | Admitting: Podiatry

## 2014-09-20 ENCOUNTER — Encounter: Payer: Self-pay | Admitting: Podiatry

## 2014-09-20 NOTE — Telephone Encounter (Signed)
PT CALLED CHECKING ON THE STATUS OF THE LETTER TO HIS PCP TO GET HIS DIABETIC SHOES AND INSERTS ORDERED. HE WAS SEEN 09/16/14. HIS PCP IS LISA RAY AND THERE FAX # IS [phone] AND PHONE # IS [phone] IF YOU NEED TO CALL THEM. PT WOULD LIKE TO KNOW STATUS ASAP.

## 2014-09-22 NOTE — Telephone Encounter (Signed)
Spoke with patient and explained the diabetic shoe process again [facility] are aware of paperwork and will return asap.  I explained as soon as she has returned the papers I will be scheduling his appointment to pick his shoes.  Patient stated understanding reiterated that this process can take 2 to 3 weeks but we have spoken to Dr Ray's office and the are willing to speed it as much as possible.

## 2014-09-28 ENCOUNTER — Telehealth: Payer: Self-pay | Admitting: *Deleted

## 2014-09-28 NOTE — Telephone Encounter (Signed)
Patient called and stated that his primary doctor still has not received the paperwork for his shoes, can someone please call and tell me what is going on

## 2014-10-04 NOTE — Telephone Encounter (Signed)
09/29/14 called and told patient we have not received paperwork yet we will call when it arrives

## 2014-10-04 NOTE — Telephone Encounter (Signed)
10/04/14 called patient we have received his paperwork and will order his shoes it will be about 3 weeks for them to come in if he has not heard anything by April 15 to call back

## 2014-11-02 ENCOUNTER — Telehealth: Payer: Self-pay | Admitting: *Deleted

## 2014-11-02 NOTE — Telephone Encounter (Signed)
Pt checking status of the diabetic shoes and inserts.

## 2014-11-04 ENCOUNTER — Telehealth: Payer: Self-pay | Admitting: Podiatry

## 2014-11-04 NOTE — Telephone Encounter (Signed)
PT CALLED ASKING ABOUT HIS DIABETIC SHOES AND INSERTS.HE HAS NOT HEARD FROM YOU AND IT HAS BEEN A MONTH. PLEASE CALL HIM ON Monday.

## 2014-11-07 NOTE — Telephone Encounter (Signed)
Left message for patient to call and schedule an appointment to pick up his diabetic shoes on a Wednesday or Friday when Dr Ardelle AntonWagoner is in.

## 2014-11-16 ENCOUNTER — Ambulatory Visit (INDEPENDENT_AMBULATORY_CARE_PROVIDER_SITE_OTHER): Payer: Medicare Other | Admitting: Podiatry

## 2014-11-16 DIAGNOSIS — E1140 Type 2 diabetes mellitus with diabetic neuropathy, unspecified: Secondary | ICD-10-CM | POA: Diagnosis not present

## 2014-11-16 DIAGNOSIS — M79672 Pain in left foot: Secondary | ICD-10-CM | POA: Diagnosis not present

## 2014-11-16 DIAGNOSIS — E1149 Type 2 diabetes mellitus with other diabetic neurological complication: Secondary | ICD-10-CM

## 2014-11-16 DIAGNOSIS — M2040 Other hammer toe(s) (acquired), unspecified foot: Secondary | ICD-10-CM

## 2014-11-16 NOTE — Patient Instructions (Signed)

## 2014-11-16 NOTE — Progress Notes (Signed)
Patient ID: Gabriel Harrison, male   DOB: 11/23/1940, 74 y.o.   MRN: [id number]  Subjective: 74 year old male presents the opposite a pickup diabetic shoes. He states that since last appointment he has had no acute changes and he has no new concerns at this time. He apparently had some ankle swelling before however has resolved. No other complaints.  Objective: AAO 3, NAD Neurovascular status unchanged Exam unchanged from last appointment. No open lesions or pre-ulcer lesions. No pain with calf compression, swelling, warmth, erythema.  Assessment: 74 year old male presents to pickup diabetic shoes.  Plan: -PATIENT PRESENTS FOR DIABETIC SHOE PICK UP, SHOES ARE TRIED ON FOR GOOD FIT.  PATIENT RECEIVED 1 PAIR APEX ARIYA MOC TOE Y900 BLACK IN MEN'S [employer] MED AND 3 PAIRS CUSTOM DIABETIC INSERTS.  PATIENT IS GIVEN WRITTEN BREAK IN AND WEAR INSTRUCTIONS.  PATIENT WILL FOLLOW UP WITH REGULAR SCHEDULED APPOINTMENT  -Follow-up next month as scheduled or sooner if any problems are to arise. In the meantime call the office with any questions, concerns, change in symptoms.

## 2014-12-16 ENCOUNTER — Ambulatory Visit: Payer: Medicare Other | Admitting: Podiatry

## 2014-12-20 ENCOUNTER — Ambulatory Visit (INDEPENDENT_AMBULATORY_CARE_PROVIDER_SITE_OTHER): Payer: Medicare Other | Admitting: Podiatry

## 2014-12-20 ENCOUNTER — Encounter: Payer: Self-pay | Admitting: Podiatry

## 2014-12-20 DIAGNOSIS — E1140 Type 2 diabetes mellitus with diabetic neuropathy, unspecified: Secondary | ICD-10-CM

## 2014-12-20 DIAGNOSIS — M79672 Pain in left foot: Secondary | ICD-10-CM | POA: Diagnosis not present

## 2014-12-20 DIAGNOSIS — B351 Tinea unguium: Secondary | ICD-10-CM | POA: Diagnosis not present

## 2014-12-20 DIAGNOSIS — E1149 Type 2 diabetes mellitus with other diabetic neurological complication: Secondary | ICD-10-CM

## 2014-12-20 NOTE — Progress Notes (Signed)
Patient ID: Gabriel Harrison, male   DOB: 05/10/1941, 74 y.o.   MRN: [id number] Complaint:  Visit Type: Patient returns to my office for continued preventative foot care services. Complaint: Patient states" my nails have grown long and thick and become painful to walk and wear shoes" Patient has been diagnosed with DM with neuropathy.Marland Kitchen He presents for preventative foot care services. No changes to ROS  Podiatric Exam: Vascular: dorsalis pedis and posterior tibial pulses are palpable bilateral. Capillary return is immediate. Temperature gradient is WNL. Skin turgor WNL  Sensorium: Diminished  Semmes Weinstein monofilament test.  Nail Exam: Pt has thick disfigured discolored nails with subungual debris noted bilateral entire nail hallux through fifth toenails Ulcer Exam: There is no evidence of ulcer or pre-ulcerative changes or infection. Orthopedic Exam: Muscle tone and strength are WNL. No limitations in general ROM. No crepitus or effusions noted. Foot type and digits show no abnormalities. Bony prominences are unremarkable. Skin: No Porokeratosis. No infection or ulcers  Diagnosis:  Tinea unguium, Pain in right toe, pain in left toes  Treatment & Plan Procedures and Treatment: Consent by patient was obtained for treatment procedures. The patient understood the discussion of treatment and procedures well. All questions were answered thoroughly reviewed. Debridement of mycotic and hypertrophic toenails, 1 through 5 bilateral and clearing of subungual debris. No ulceration, no infection noted.  Return Visit-Office Procedure: Patient instructed to return to the office for a follow up visit 3 months for continued evaluation and treatment.

## 2015-03-23 ENCOUNTER — Ambulatory Visit: Payer: Medicare Other | Admitting: Podiatry

## 2015-04-13 IMAGING — US US ABDOMEN LIMITED
1 series · 14 of 25 positions shown · non-contrast
Comparison: CT ABD/PELVIS W CM dated 09/11/2013

CLINICAL DATA: Right upper quadrant pain.

EXAM:
US ABDOMEN LIMITED - RIGHT UPPER QUADRANT

[Series 1: us abdomen limited · 0.24mm/px · 14 of 27 slices shown]
[im 1/27]
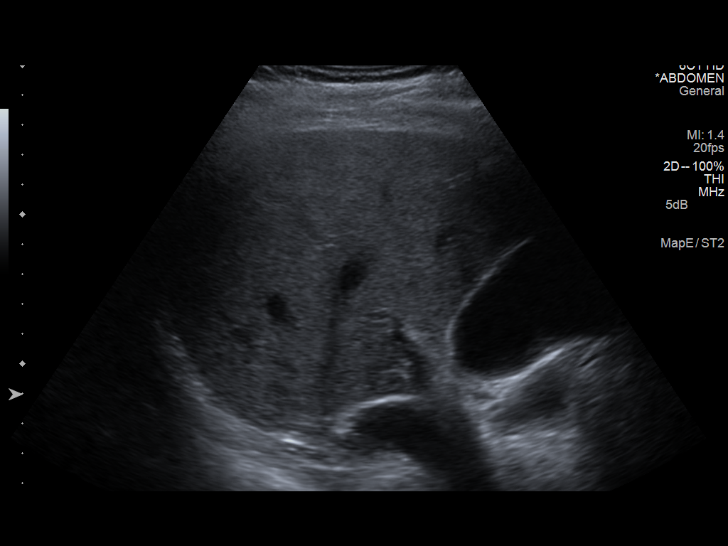
[im 3/27]
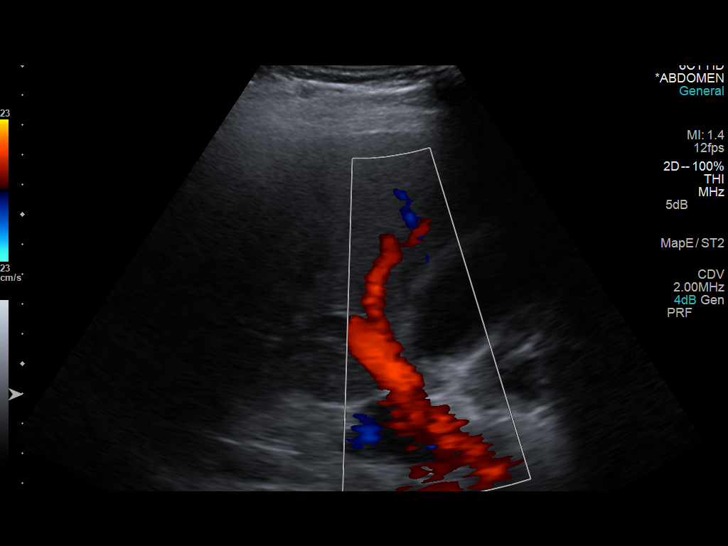
[im 5/27]
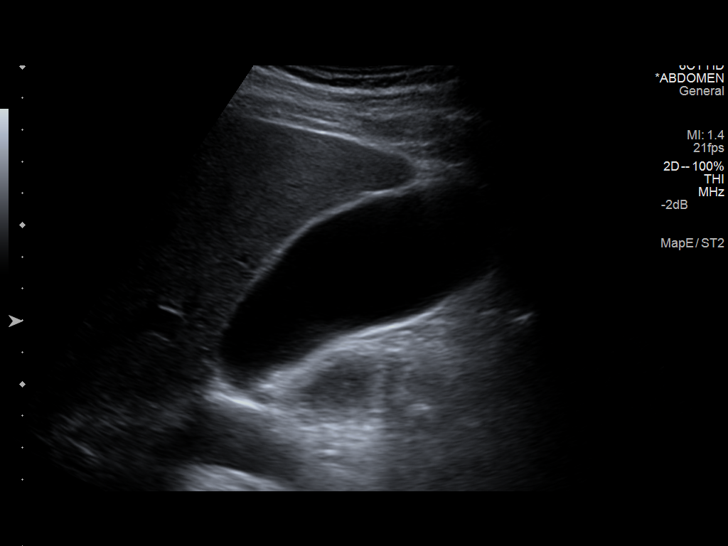
[im 7/27]
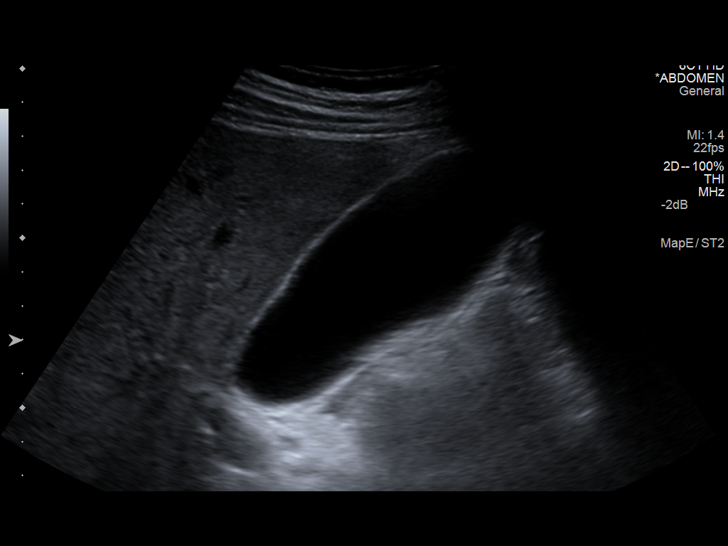
[im 9/27]
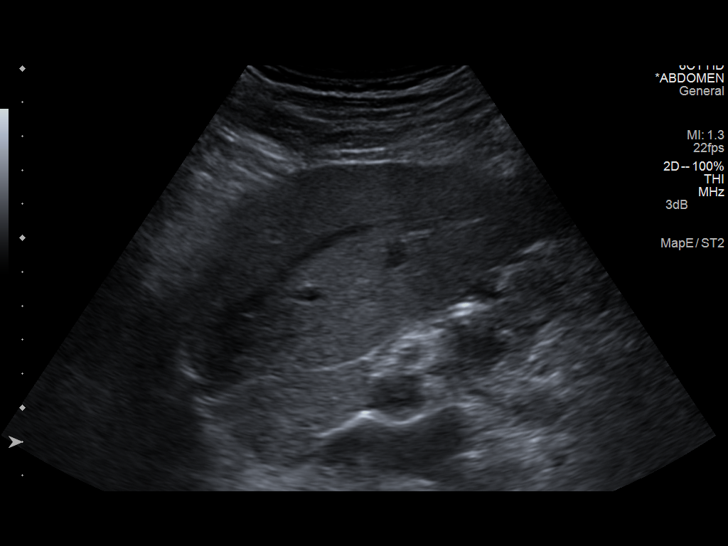
[im 10/27]
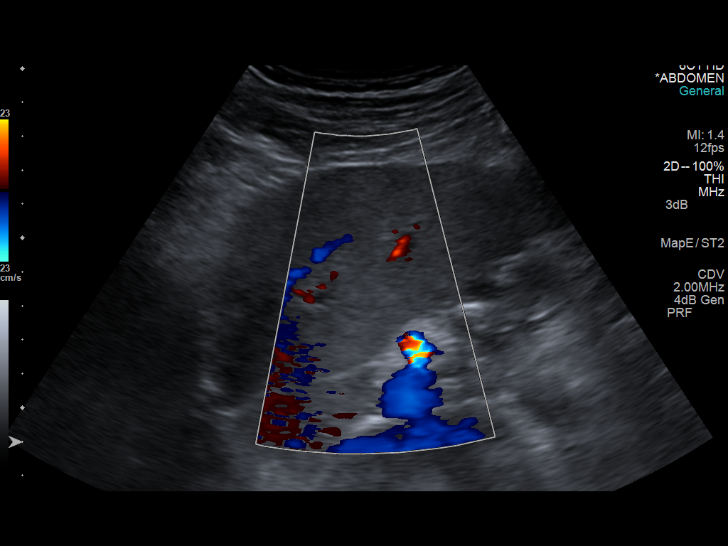
[im 12/27]
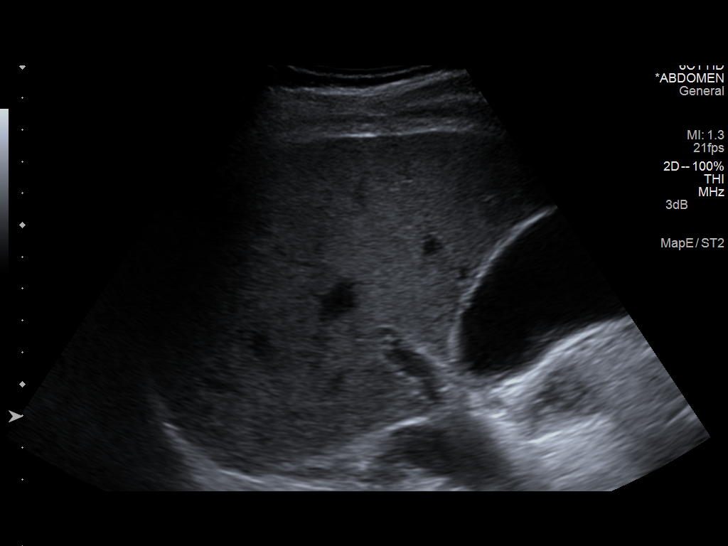
[im 15/27]
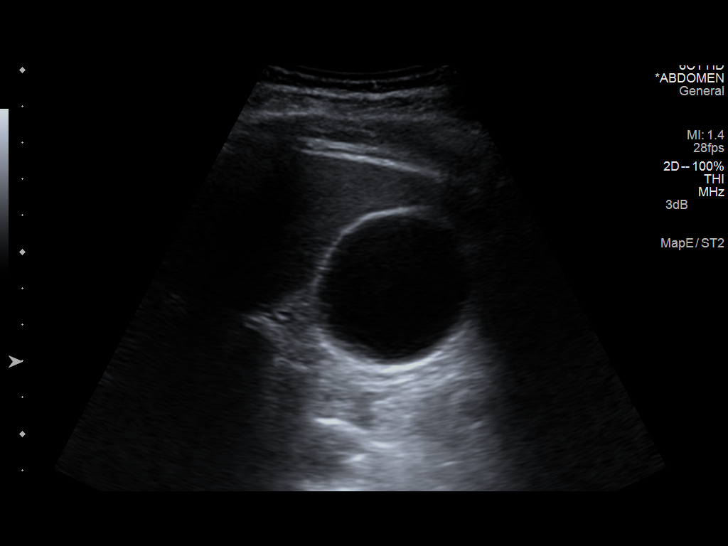
[im 17/27]
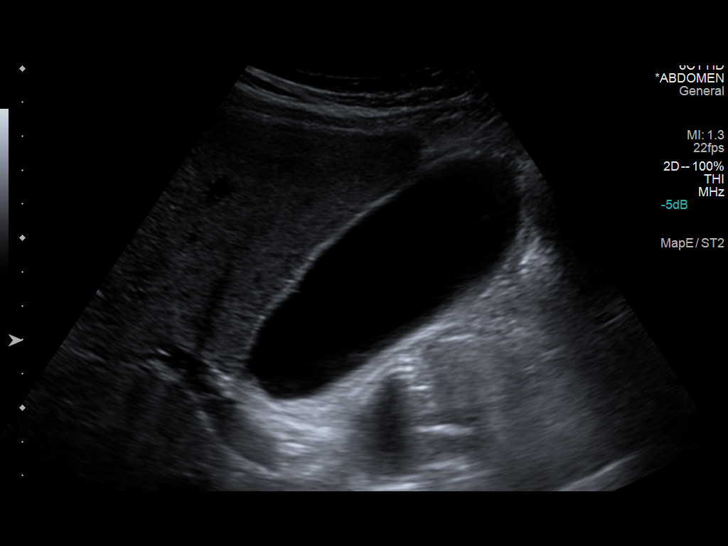
[im 18/27]
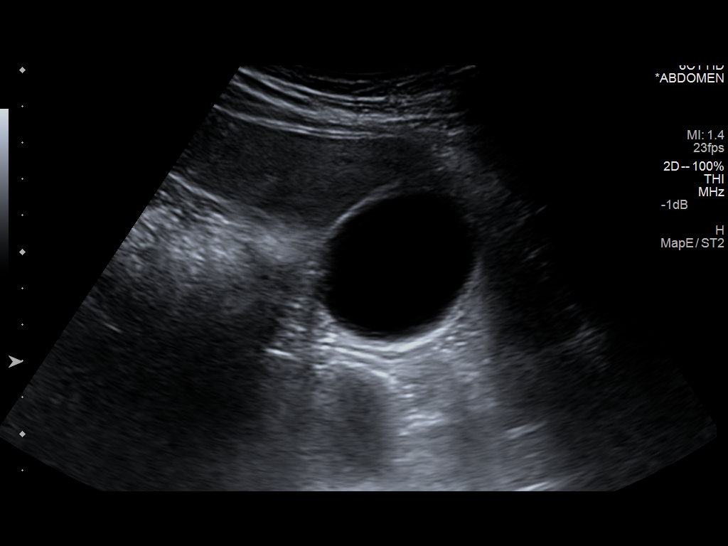
[im 20/27]
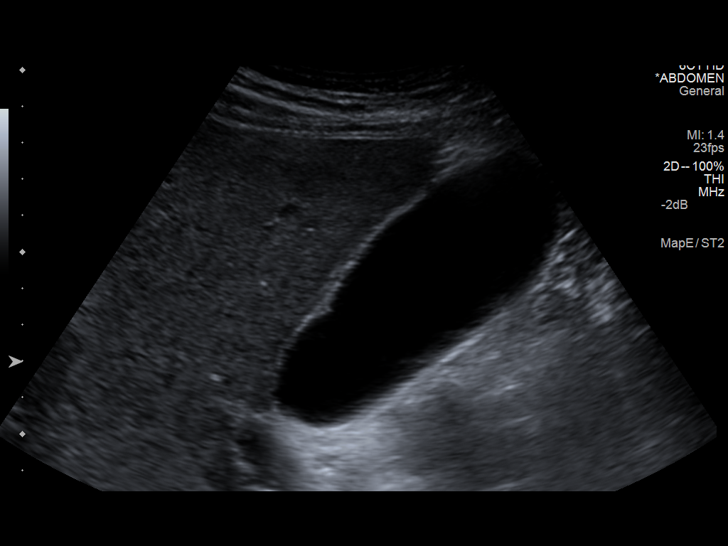
[im 22/27]
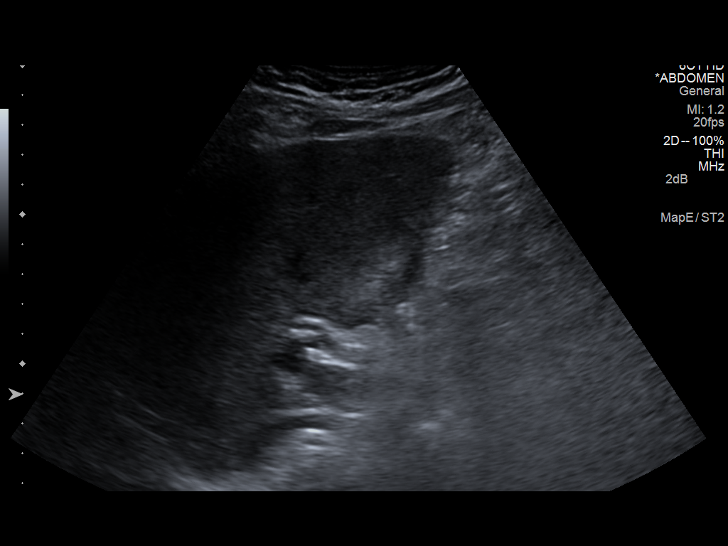
[im 24/27]
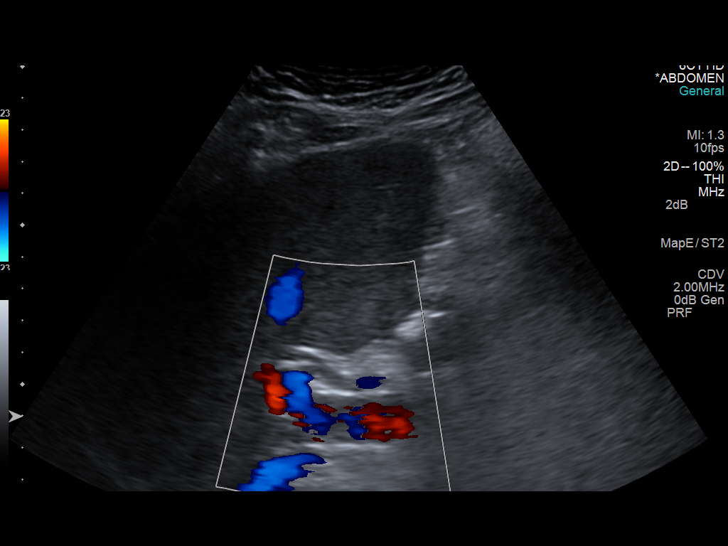
[im 27/27]
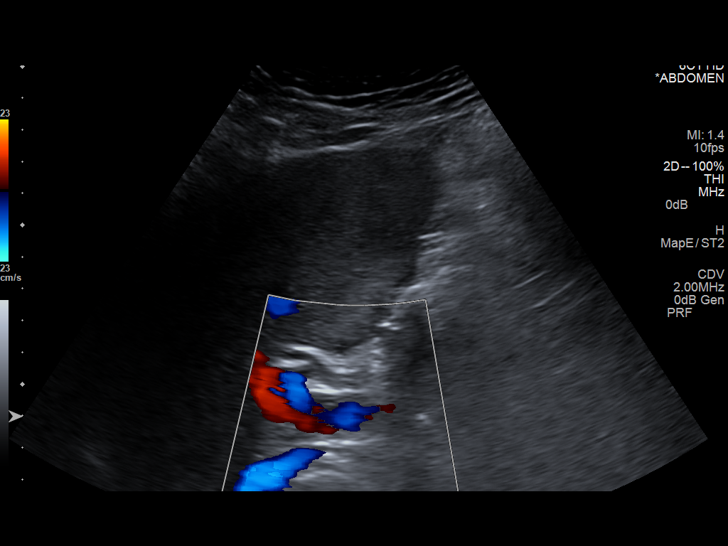

[14 of 25 positions shown; findings below may reference images not displayed]

FINDINGS: Gallbladder:

Gallbladder is mildly distended but no evidence for gallstones,
sludge or wall thickening.

Common bile duct:

Diameter: 0.5 cm.

Liver:

Normal appearance of the liver without focal abnormality. Main
portal vein is patent.
IMPRESSION: Normal right upper quadrant ultrasound.

## 2016-02-26 ENCOUNTER — Ambulatory Visit (INDEPENDENT_AMBULATORY_CARE_PROVIDER_SITE_OTHER): Payer: Medicare Other | Admitting: Physician Assistant

## 2016-02-26 ENCOUNTER — Ambulatory Visit (INDEPENDENT_AMBULATORY_CARE_PROVIDER_SITE_OTHER): Payer: Medicare Other

## 2016-02-26 VITALS — BP 140/80 | HR 96 | Temp 97.4°F | Resp 18 | Ht 71.0 in | Wt 196.0 lb

## 2016-02-26 DIAGNOSIS — M25552 Pain in left hip: Secondary | ICD-10-CM | POA: Diagnosis not present

## 2016-02-26 DIAGNOSIS — M5432 Sciatica, left side: Secondary | ICD-10-CM | POA: Diagnosis not present

## 2016-02-26 DIAGNOSIS — M25562 Pain in left knee: Secondary | ICD-10-CM

## 2016-02-26 MED ORDER — PREDNISONE 20 MG PO TABS: 40.0000 mg | ORAL_TABLET | Freq: Every day | ORAL | 0 refills | Status: DC

## 2016-02-26 NOTE — Patient Instructions (Addendum)
You likely have a bulged disc in your low back that is pinching a nerve and causing some of your leg pain. 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 Please take the prednisone 2 tabs daily for 5 days. This will help to relieve the inflammation. Keep in mind this can increase your blood sugar. Be sure to keep a close eye on your blood sugar while you're taking this. Slight increases in your sugar are ok, but please let us know if your sugar starts trending too high. Please be sure to follow up with your PCP and orthopedic physician soon. They may consider steroid injections in the left knee.   Sciatica With Rehab The sciatic nerve runs from the back down the leg and is responsible for sensation and control of the muscles in the back (posterior) side of the thigh, lower leg, and foot. Sciatica is a condition that is characterized by inflammation of this nerve.  SYMPTOMS   Signs of nerve damage, including numbness and/or weakness along the posterior side of the lower extremity.  Pain in the back of the thigh that may also travel down the leg.  Pain that worsens when sitting for long periods of time.  Occasionally, pain in the back or buttock. CAUSES  Inflammation of the sciatic nerve is the cause of sciatica. The inflammation is due to something irritating the nerve. Common sources of irritation include:  Sitting for long periods of time.  Direct trauma to the nerve.  Arthritis of the spine.  Herniated or ruptured disk.  Slipping of the vertebrae (spondylolisthesis).  Pressure from soft tissues, such as muscles or ligament-like tissue (fascia). RISK INCREASES WITH:  Sports that place pressure or stress on the spine (football or weightlifting).  Poor strength and flexibility.  Failure to warm up properly before activity.  Family history of low back pain or disk disorders.  Previous back injury or surgery.  Poor body mechanics, especially when lifting, or  poor posture. PREVENTION   Warm up and stretch properly before activity.  Maintain physical fitness:  Strength, flexibility, and endurance.  Cardiovascular fitness.  Learn and use proper technique, especially with posture and lifting. When possible, have coach correct improper technique.  Avoid activities that place stress on the spine. PROGNOSIS If treated properly, then sciatica usually resolves within 6 weeks. However, occasionally surgery is necessary.  RELATED COMPLICATIONS   Permanent nerve damage, including pain, numbness, tingle, or weakness.  Chronic back pain.  Risks of surgery: infection, bleeding, nerve damage, or damage to surrounding tissues. TREATMENT Treatment initially involves resting from any activities that aggravate your symptoms. The use of ice and medication may help reduce pain and inflammation. The use of strengthening and stretching exercises may help reduce pain with activity. These exercises may be performed at home or with referral to a therapist. A therapist may recommend further treatments, such as transcutaneous electronic nerve stimulation (TENS) or ultrasound. Your caregiver may recommend corticosteroid injections to help reduce inflammation of the sciatic nerve. If symptoms persist despite non-surgical (conservative) treatment, then surgery may be recommended. MEDICATION  If pain medication is necessary, then nonsteroidal anti-inflammatory medications, such as aspirin and ibuprofen, or other minor pain relievers, such as acetaminophen, are often recommended.  Do not take pain medication for 7 days before surgery.  Prescription pain relievers may be given if deemed necessary by your caregiver. Use only as directed and only as much as you need.  Ointments applied to the skin may be helpful.  Corticosteroid injections may be given by your caregiver. These injections should be reserved for the most serious cases, because they may only be given a  certain number of times. HEAT AND COLD  Cold treatment (icing) relieves pain and reduces inflammation. Cold treatment should be applied for 10 to 15 minutes every 2 to 3 hours for inflammation and pain and immediately after any activity that aggravates your symptoms. Use ice packs or massage the area with a piece of ice (ice massage).  Heat treatment may be used prior to performing the stretching and strengthening activities prescribed by your caregiver, physical therapist, or athletic trainer. Use a heat pack or soak the injury in warm water. SEEK MEDICAL CARE IF:  Treatment seems to offer no benefit, or the condition worsens.  Any medications produce adverse side effects. EXERCISES  RANGE OF MOTION (ROM) AND STRETCHING EXERCISES - Sciatica Most people with sciatic will find that their symptoms worsen with either excessive bending forward (flexion) or arching at the low back (extension). The exercises which will help resolve your symptoms will focus on the opposite motion. Your physician, physical therapist or athletic trainer will help you determine which exercises will be most helpful to resolve your low back pain. Do not complete any exercises without first consulting with your clinician. Discontinue any exercises which worsen your symptoms until you speak to your clinician. If you have pain, numbness or tingling which travels down into your buttocks, leg or foot, the goal of the therapy is for these symptoms to move closer to your back and eventually resolve. Occasionally, these leg symptoms will get better, but your low back pain may worsen; this is typically an indication of progress in your rehabilitation. Be certain to be very alert to any changes in your symptoms and the activities in which you participated in the 24 hours prior to the change. Sharing this information with your clinician will allow him/her to most efficiently treat your condition. These exercises may help you when beginning  to rehabilitate your injury. Your symptoms may resolve with or without further involvement from your physician, physical therapist or athletic trainer. While completing these exercises, remember:   Restoring tissue flexibility helps normal motion to return to the joints. This allows healthier, less painful movement and activity.  An effective stretch should be held for at least 30 seconds.  A stretch should never be painful. You should only feel a gentle lengthening or release in the stretched tissue. FLEXION RANGE OF MOTION AND STRETCHING EXERCISES: STRETCH - Flexion, Single Knee to Chest   Lie on a firm bed or floor with both legs extended in front of you.  Keeping one leg in contact with the floor, bring your opposite knee to your chest. Hold your leg in place by either grabbing behind your thigh or at your knee.  Pull until you feel a gentle stretch in your low back. Hold __________ seconds.  Slowly release your grasp and repeat the exercise with the opposite side. Repeat __________ times. Complete this exercise __________ times per day.  STRETCH - Flexion, Double Knee to Chest  Lie on a firm bed or floor with both legs extended in front of you.  Keeping one leg in contact with the floor, bring your opposite knee to your chest.  Tense your stomach muscles to support your back and then lift your other knee to your chest. Hold your legs in place by either grabbing behind your thighs or at your knees.  Pull both knees toward  your chest until you feel a gentle stretch in your low back. Hold __________ seconds.  Tense your stomach muscles and slowly return one leg at a time to the floor. Repeat __________ times. Complete this exercise __________ times per day.  STRETCH - Low Trunk Rotation   Lie on a firm bed or floor. Keeping your legs in front of you, bend your knees so they are both pointed toward the ceiling and your feet are flat on the floor.  Extend your arms out to the side.  This will stabilize your upper body by keeping your shoulders in contact with the floor.  Gently and slowly drop both knees together to one side until you feel a gentle stretch in your low back. Hold for __________ seconds.  Tense your stomach muscles to support your low back as you bring your knees back to the starting position. Repeat the exercise to the other side. Repeat __________ times. Complete this exercise __________ times per day  EXTENSION RANGE OF MOTION AND FLEXIBILITY EXERCISES: STRETCH - Extension, Prone on Elbows  Lie on your stomach on the floor, a bed will be too soft. Place your palms about shoulder width apart and at the height of your head.  Place your elbows under your shoulders. If this is too painful, stack pillows under your chest.  Allow your body to relax so that your hips drop lower and make contact more completely with the floor.  Hold this position for __________ seconds.  Slowly return to lying flat on the floor. Repeat __________ times. Complete this exercise __________ times per day.  RANGE OF MOTION - Extension, Prone Press Ups  Lie on your stomach on the floor, a bed will be too soft. Place your palms about shoulder width apart and at the height of your head.  Keeping your back as relaxed as possible, slowly straighten your elbows while keeping your hips on the floor. You may adjust the placement of your hands to maximize your comfort. As you gain motion, your hands will come more underneath your shoulders.  Hold this position __________ seconds.  Slowly return to lying flat on the floor. Repeat __________ times. Complete this exercise __________ times per day.  STRENGTHENING EXERCISES - Sciatica  These exercises may help you when beginning to rehabilitate your injury. These exercises should be done near your "sweet spot." This is the neutral, low-back arch, somewhere between fully rounded and fully arched, that is your least painful position. When  performed in this safe range of motion, these exercises can be used for people who have either a flexion or extension based injury. These exercises may resolve your symptoms with or without further involvement from your physician, physical therapist or athletic trainer. While completing these exercises, remember:   Muscles can gain both the endurance and the strength needed for everyday activities through controlled exercises.  Complete these exercises as instructed by your physician, physical therapist or athletic trainer. Progress with the resistance and repetition exercises only as your caregiver advises.  You may experience muscle soreness or fatigue, but the pain or discomfort you are trying to eliminate should never worsen during these exercises. If this pain does worsen, stop and make certain you are following the directions exactly. If the pain is still present after adjustments, discontinue the exercise until you can discuss the trouble with your clinician. STRENGTHENING - Deep Abdominals, Pelvic Tilt   Lie on a firm bed or floor. Keeping your legs in front of you, bend your knees  so they are both pointed toward the ceiling and your feet are flat on the floor.  Tense your lower abdominal muscles to press your low back into the floor. This motion will rotate your pelvis so that your tail bone is scooping upwards rather than pointing at your feet or into the floor.  With a gentle tension and even breathing, hold this position for __________ seconds. Repeat __________ times. Complete this exercise __________ times per day.  STRENGTHENING - Abdominals, Crunches   Lie on a firm bed or floor. Keeping your legs in front of you, bend your knees so they are both pointed toward the ceiling and your feet are flat on the floor. Cross your arms over your chest.  Slightly tip your chin down without bending your neck.  Tense your abdominals and slowly lift your trunk high enough to just clear your  shoulder blades. Lifting higher can put excessive stress on the low back and does not further strengthen your abdominal muscles.  Control your return to the starting position. Repeat __________ times. Complete this exercise __________ times per day.  STRENGTHENING - Quadruped, Opposite UE/LE Lift  Assume a hands and knees position on a firm surface. Keep your hands under your shoulders and your knees under your hips. You may place padding under your knees for comfort.  Find your neutral spine and gently tense your abdominal muscles so that you can maintain this position. Your shoulders and hips should form a rectangle that is parallel with the floor and is not twisted.  Keeping your trunk steady, lift your right hand no higher than your shoulder and then your left leg no higher than your hip. Make sure you are not holding your breath. Hold this position __________ seconds.  Continuing to keep your abdominal muscles tense and your back steady, slowly return to your starting position. Repeat with the opposite arm and leg. Repeat __________ times. Complete this exercise __________ times per day.  STRENGTHENING - Abdominals and Quadriceps, Straight Leg Raise   Lie on a firm bed or floor with both legs extended in front of you.  Keeping one leg in contact with the floor, bend the other knee so that your foot can rest flat on the floor.  Find your neutral spine, and tense your abdominal muscles to maintain your spinal position throughout the exercise.  Slowly lift your straight leg off the floor about 6 inches for a count of 15, making sure to not hold your breath.  Still keeping your neutral spine, slowly lower your leg all the way to the floor. Repeat this exercise with each leg __________ times. Complete this exercise __________ times per day. POSTURE AND BODY MECHANICS CONSIDERATIONS - Sciatica Keeping correct posture when sitting, standing or completing your activities will reduce the  stress put on different body tissues, allowing injured tissues a chance to heal and limiting painful experiences. The following are general guidelines for improved posture. Your physician or physical therapist will provide you with any instructions specific to your needs. While reading these guidelines, remember:  The exercises prescribed by your provider will help you have the flexibility and strength to maintain correct postures.  The correct posture provides the optimal environment for your joints to work. All of your joints have less wear and tear when properly supported by a spine with good posture. This means you will experience a healthier, less painful body.  Correct posture must be practiced with all of your activities, especially prolonged sitting and standing. Correct  posture is as important when doing repetitive low-stress activities (typing) as it is when doing a single heavy-load activity (lifting). RESTING POSITIONS Consider which positions are most painful for you when choosing a resting position. If you have pain with flexion-based activities (sitting, bending, stooping, squatting), choose a position that allows you to rest in a less flexed posture. You would want to avoid curling into a fetal position on your side. If your pain worsens with extension-based activities (prolonged standing, working overhead), avoid resting in an extended position such as sleeping on your stomach. Most people will find more comfort when they rest with their spine in a more neutral position, neither too rounded nor too arched. Lying on a non-sagging bed on your side with a pillow between your knees, or on your back with a pillow under your knees will often provide some relief. Keep in mind, being in any one position for a prolonged period of time, no matter how correct your posture, can still lead to stiffness. PROPER SITTING POSTURE In order to minimize stress and discomfort on your spine, you must sit with  correct posture Sitting with good posture should be effortless for a healthy body. Returning to good posture is a gradual process. Many people can work toward this most comfortably by using various supports until they have the flexibility and strength to maintain this posture on their own. When sitting with proper posture, your ears will fall over your shoulders and your shoulders will fall over your hips. You should use the back of the chair to support your upper back. Your low back will be in a neutral position, just slightly arched. You may place a small pillow or folded towel at the base of your low back for support.  When working at a desk, create an environment that supports good, upright posture. Without extra support, muscles fatigue and lead to excessive strain on joints and other tissues. Keep these recommendations in mind: CHAIR:   A chair should be able to slide under your desk when your back makes contact with the back of the chair. This allows you to work closely.  The chair's height should allow your eyes to be level with the upper part of your monitor and your hands to be slightly lower than your elbows. BODY POSITION  Your feet should make contact with the floor. If this is not possible, use a foot rest.  Keep your ears over your shoulders. This will reduce stress on your neck and low back. INCORRECT SITTING POSTURES   If you are feeling tired and unable to assume a healthy sitting posture, do not slouch or slump. This puts excessive strain on your back tissues, causing more damage and pain. Healthier options include:  Using more support, like a lumbar pillow.  Switching tasks to something that requires you to be upright or walking.  Talking a brief walk.  Lying down to rest in a neutral-spine position. PROLONGED STANDING WHILE SLIGHTLY LEANING FORWARD  When completing a task that requires you to lean forward while standing in one place for a long time, place either foot up  on a stationary 2-4 inch high object to help maintain the best posture. When both feet are on the ground, the low back tends to lose its slight inward curve. If this curve flattens (or becomes too large), then the back and your other joints will experience too much stress, fatigue more quickly and can cause pain.  CORRECT STANDING POSTURES Proper standing posture should be  assumed with all daily activities, even if they only take a few moments, like when brushing your teeth. As in sitting, your ears should fall over your shoulders and your shoulders should fall over your hips. You should keep a slight tension in your abdominal muscles to brace your spine. Your tailbone should point down to the ground, not behind your body, resulting in an over-extended swayback posture.  INCORRECT STANDING POSTURES  Common incorrect standing postures include a forward head, locked knees and/or an excessive swayback. WALKING Walk with an upright posture. Your ears, shoulders and hips should all line-up. PROLONGED ACTIVITY IN A FLEXED POSITION When completing a task that requires you to bend forward at your waist or lean over a low surface, try to find a way to stabilize 3 of 4 of your limbs. You can place a hand or elbow on your thigh or rest a knee on the surface you are reaching across. This will provide you more stability so that your muscles do not fatigue as quickly. By keeping your knees relaxed, or slightly bent, you will also reduce stress across your low back. CORRECT LIFTING TECHNIQUES DO :   Assume a wide stance. This will provide you more stability and the opportunity to get as close as possible to the object which you are lifting.  Tense your abdominals to brace your spine; then bend at the knees and hips. Keeping your back locked in a neutral-spine position, lift using your leg muscles. Lift with your legs, keeping your back straight.  Test the weight of unknown objects before attempting to lift  them.  Try to keep your elbows locked down at your sides in order get the best strength from your shoulders when carrying an object.  Always ask for help when lifting heavy or awkward objects. INCORRECT LIFTING TECHNIQUES DO NOT:   Lock your knees when lifting, even if it is a small object.  Bend and twist. Pivot at your feet or move your feet when needing to change directions.  Assume that you cannot safely pick up a paperclip without proper posture.   This information is not intended to replace advice given to you by your health care provider. Make sure you discuss any questions you have with your health care provider.   Document Released: 06/24/2005 Document Revised: 11/08/2014 Document Reviewed: 10/06/2008 [employer] Patient Education [employer]

## 2016-02-26 NOTE — Progress Notes (Signed)
Patient ID: Gabriel Harrison, male    DOB: [birth year], 75 y.o.   MRN: [id number]  PCP: Pcp Not In System  Chief Complaint  Patient presents with  . Hip Pain    LEFT HIP AND KNEE PAIN    Subjective:   HPI:  [age] yom presenting with above complaints.   Has had chronic mild LE joints aches for several years now. Chart review notes xr imaging from 2009 showing degenerative changes in lower lumbar spine and bilateral hips.   Approx one week ago started having worsening central low back pain that radiates around to left hip, down left lateral leg, and into left knee. Denies trauma. Denies bowel/bladder incontinence. Denies fevers, chills. Has had this several times over the years but never as annoying as this episode.   Has tried heating pad on left hip which helps to ease pain. Pain described as both aching and sharp/shooting. Comes and goes throughout day but typically gets worse as day goes on.   There are no active problems to display for this patient.   Past Medical History:  Diagnosis Date  . Arthritis   . Diabetes mellitus without complication (HCC)   . Hypertension      Prior to Admission medications   Medication Sig Start Date End Date Taking? Authorizing Provider  acetaminophen (TYLENOL) 325 MG tablet Take 650 mg by mouth every 6 (six) hours as needed.   Yes Historical Provider, MD  amLODipine (NORVASC) 5 MG tablet  12/12/14  Yes Historical Provider, MD  benazepril (LOTENSIN) 40 MG tablet take 1 tablet by mouth twice a day 08/16/14  Yes Historical Provider, MD  docusate sodium (COLACE) 100 MG capsule Take 1 capsule (100 mg total) by mouth daily. 09/11/13  Yes Purvis SheffieldForrest Harrison, MD  glipiZIDE (GLUCOTROL XL) 10 MG 24 hr tablet Take by mouth. 11/17/14  Yes Historical Provider, MD  metFORMIN (GLUCOPHAGE) 1000 MG tablet take 1 and 1/2 tablets by mouth IN THE MORNING AND 1 TAB IN THE EVENING 05/19/14  Yes Historical Provider, MD  simvastatin (ZOCOR) 20 MG tablet  09/13/14  Yes Historical  Provider, MD  tamsulosin (FLOMAX) 0.4 MG CAPS capsule Take by mouth. 02/17/14  Yes Historical Provider, MD  traMADol (ULTRAM) 50 MG tablet Take 50 mg by mouth at bedtime as needed for moderate pain.   Yes Historical Provider, MD  chlorthalidone (HYGROTON) 25 MG tablet Take by mouth. 07/21/14 07/21/15  Historical Provider, MD  oxyCODONE-acetaminophen (PERCOCET) 5-325 MG per tablet Take 1 tablet by mouth every 4 (four) hours as needed. Patient not taking: Reported on 02/26/2016 09/11/13   Purvis SheffieldForrest Harrison, MD    No Known Allergies  History reviewed. No pertinent surgical history.  History reviewed. No pertinent family history.  Social History   Social History  . Marital status: Widowed    Spouse name: N/A  . Number of children: N/A  . Years of education: N/A   Social History Main Topics  . Smoking status: Never Smoker  . Smokeless tobacco: Never Used  . Alcohol use No  . Drug use: No  . Sexual activity: Not Asked   Other Topics Concern  . None   Social History Narrative  . None   Review of Systems  Denies cp, sob, abd pain, dysuria, hematuria, melena, brbpr.     Objective:  Physical Exam  Constitutional: He appears well-developed and well-nourished.  Non-toxic appearance. He does not have a sickly appearance. He does not appear ill. No distress.  Cardiovascular:  Pulses:  Dorsalis pedis pulses are 2+ on the left side.       Posterior tibial pulses are 2+ on the left side.  No edema LLE. LLE warm.   Abdominal: Soft. Normal appearance and bowel sounds are normal. There is no tenderness.  Musculoskeletal:       Left hip: He exhibits tenderness.       Left knee: He exhibits normal range of motion, no swelling, no effusion, no deformity, normal patellar mobility and no bony tenderness. Tenderness found. Medial joint line tenderness noted. No patellar tendon tenderness noted.       Lumbar back: He exhibits tenderness and pain. He exhibits normal range of motion and no bony  tenderness.       Back:  Left lower back ttp. No spinal ttp.   Neurological:  Positive straight leg raise test at 30 degrees left leg. Negative on right side.   Skin: Skin is warm and dry. No rash noted. No pallor.   8/21 Left knee xr FINDINGS: Tricompartment degenerative change with chondrocalcinosis noted. No evidence fracture or dislocation. Loose bodies are noted. Peripheral vascular calcification  IMPRESSION: 1. 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 No acute bony abnormality.  2. Peripheral vascular disease .  8/21 Left hip xr FINDINGS: Degenerative changes lumbar spine and both hips. No acute bony abnormality identified. No evidence fracture dislocation. Pelvic calcifications noted consistent phleboliths.  IMPRESSION: Degenerative changes lumbar spine and both hips. Degenerative changes about both hips particularly severe. No acute abnormality.    Assessment & Plan:   Hip pain, acute, left - Plan: DG HIP UNILAT W OR W/O PELVIS 2-3 VIEWS LEFT  Left knee pain - Plan: DG Knee 1-2 Views Left  Sciatica of left side - Plan: predniSONE (DELTASONE) 20 MG tablet - Suspect sciatica leading to low back, hip, thigh, and knee discomfort for past week as had no trauma and with positive SLR test in clinic - D/w Dr Neva SeatGreene options for sciatica treatment given pts age and comorbidities - no flexeril or mobic given age and cardiac RFs, will give short po pred burst - Known DMII with oral agents at home, BG 98 this am and most recent A1C 7.0%, encouraged strict BG adherence over next week - OA/PVD noted on imaging, pt encouraged to f/u with PCP for these findings - Given rehab exercises to work with sciatica, encouraged to start slowly in several days to weeks once flare pain has resolved   Donnajean Lopesodd M. Ethelyne Erich, PA-C Physician Assistant-Certified [facility]  02/27/2016 11:46 AM

## 2016-02-27 NOTE — Progress Notes (Addendum)
Patient discussed with Mr. Agustin CreeMcVeigh. Agree with assessment and plan of care per his note.  Signed,   Meredith StaggersJeffrey Safira Proffit, MD [facility].  02/27/16 9:06 PM

## 2016-02-29 ENCOUNTER — Other Ambulatory Visit: Payer: Self-pay | Admitting: Physician Assistant

## 2016-02-29 DIAGNOSIS — M5432 Sciatica, left side: Secondary | ICD-10-CM

## 2016-02-29 NOTE — Telephone Encounter (Signed)
Patient is calling because he wont see his primary doctor until next week and and he needs 5 more days worth of prednisone. Please advise!

## 2016-03-02 ENCOUNTER — Other Ambulatory Visit: Payer: Self-pay | Admitting: Physician Assistant

## 2016-03-02 MED ORDER — TRAMADOL HCL 50 MG PO TABS: ORAL_TABLET | ORAL | 0 refills | Status: AC

## 2016-03-02 NOTE — Telephone Encounter (Signed)
Per Gabriel Harrison will not refill prednisone.  We can send in Tramadol and he can take Tylenol.  [facility]

## 2016-03-02 NOTE — Telephone Encounter (Signed)
Pt called again to see if we will refill his prednisone. Pt states he is pain and needs some relief.  His sugars did ok with the last dose of prednisone.  He cannot get into his pcp until the 03/21/16.  Is there anything we can do for this gentleman.  [phone]

## 2020-02-27 ENCOUNTER — Observation Stay (HOSPITAL_COMMUNITY)
Admission: EM | Admit: 2020-02-27 | Discharge: 2020-02-28 | Disposition: A | Discharge status: Still In Hospital | Payer: Medicare (Managed Care) | Attending: Emergency Medicine | Admitting: Emergency Medicine

## 2020-02-27 ENCOUNTER — Other Ambulatory Visit: Payer: Self-pay

## 2020-02-27 ENCOUNTER — Encounter (HOSPITAL_COMMUNITY): Payer: Self-pay | Admitting: *Deleted

## 2020-02-27 ENCOUNTER — Emergency Department (HOSPITAL_COMMUNITY): Payer: Medicare (Managed Care)

## 2020-02-27 DIAGNOSIS — Z7984 Long term (current) use of oral hypoglycemic drugs: Secondary | ICD-10-CM | POA: Insufficient documentation

## 2020-02-27 DIAGNOSIS — R1031 Right lower quadrant pain: Secondary | ICD-10-CM | POA: Diagnosis present

## 2020-02-27 DIAGNOSIS — K4090 Unilateral inguinal hernia, without obstruction or gangrene, not specified as recurrent: Secondary | ICD-10-CM | POA: Diagnosis not present

## 2020-02-27 DIAGNOSIS — Z79899 Other long term (current) drug therapy: Secondary | ICD-10-CM | POA: Insufficient documentation

## 2020-02-27 DIAGNOSIS — E119 Type 2 diabetes mellitus without complications: Secondary | ICD-10-CM | POA: Insufficient documentation

## 2020-02-27 DIAGNOSIS — I10 Essential (primary) hypertension: Secondary | ICD-10-CM | POA: Diagnosis not present

## 2020-02-27 DIAGNOSIS — Z20822 Contact with and (suspected) exposure to covid-19: Secondary | ICD-10-CM | POA: Diagnosis not present

## 2020-02-27 LAB — CBC
HCT: 40.1 % (ref 39.0–52.0)
Hemoglobin: 13 g/dL (ref 13.0–17.0)
MCH: 27.1 pg (ref 26.0–34.0)
MCHC: 32.4 g/dL (ref 30.0–36.0)
MCV: 83.5 fL (ref 80.0–100.0)
Platelets: 244 10*3/uL (ref 150–400)
RBC: 4.8 MIL/uL (ref 4.22–5.81)
RDW: 13.6 % (ref 11.5–15.5)
WBC: 4 10*3/uL (ref 4.0–10.5)
nRBC: 0 % (ref 0.0–0.2)

## 2020-02-27 LAB — COMPREHENSIVE METABOLIC PANEL
ALT: 10 U/L (ref 0–44)
AST: 16 U/L (ref 15–41)
Albumin: 3.8 g/dL (ref 3.5–5.0)
Alkaline Phosphatase: 70 U/L (ref 38–126)
Anion gap: 11 (ref 5–15)
BUN: 9 mg/dL (ref 8–23)
CO2: 27 mmol/L (ref 22–32)
Calcium: 9.2 mg/dL (ref 8.9–10.3)
Chloride: 102 mmol/L (ref 98–111)
Creatinine, Ser: 1.01 mg/dL (ref 0.61–1.24)
GFR calc Af Amer: >60 mL/min (ref >60)
GFR calc non Af Amer: >60 mL/min (ref >60)
Glucose, Bld: 113 mg/dL — ABNORMAL HIGH (ref 70–99)
Potassium: 4.4 mmol/L (ref 3.5–5.1)
Sodium: 140 mmol/L (ref 135–145)
Total Bilirubin: 0.6 mg/dL (ref 0.3–1.2)
Total Protein: 6.7 g/dL (ref 6.5–8.1)

## 2020-02-27 LAB — URINALYSIS, ROUTINE W REFLEX MICROSCOPIC
Bilirubin Urine: NEGATIVE
Glucose, UA: NEGATIVE mg/dL
Hgb urine dipstick: NEGATIVE
Ketones, ur: NEGATIVE mg/dL
Leukocytes,Ua: NEGATIVE
Nitrite: NEGATIVE
Protein, ur: NEGATIVE mg/dL
Specific Gravity, Urine: 1.02 (ref 1.005–1.030)
pH: 5 (ref 5.0–8.0)

## 2020-02-27 LAB — POC OCCULT BLOOD, ED: Fecal Occult Bld: NEGATIVE

## 2020-02-27 LAB — LIPASE, BLOOD: Lipase: 50 U/L (ref 11–51)

## 2020-02-27 MED ORDER — IOHEXOL 300 MG/ML  SOLN
100.0000 mL | Freq: Once | INTRAMUSCULAR | Status: AC | PRN
  Administered 2020-02-27: 100 mL via INTRAVENOUS

## 2020-02-27 MED ORDER — MORPHINE SULFATE (PF) 2 MG/ML IV SOLN
2.0000 mg | Freq: Once | INTRAVENOUS | Status: AC
  Administered 2020-02-27: 2 mg via INTRAVENOUS
  Filled 2020-02-27: qty 1

## 2020-02-27 MED ORDER — FENTANYL CITRATE (PF) 100 MCG/2ML IJ SOLN
50.0000 ug | Freq: Once | INTRAMUSCULAR | Status: DC
  Filled 2020-02-27: qty 2

## 2020-02-27 NOTE — ED Provider Notes (Signed)
[facility] EMERGENCY DEPARTMENT Provider Note   CSN: [id number] Arrival date & time: 02/27/20  5465     History Chief Complaint  Patient presents with  . Abdominal Pain    Gabriel Harrison is a 79 y.o. male.  HPI   Patient with significant medical history of arthritis, hypertension, diabetes presents to the emergency department with chief complaint of right lower abdominal pain that has worsened over the last week.  Patient explains he was seen by his primary care doctor on Wednesday and they diagnosed him with a inguinal hernia.  Since then he has had increased right lower abdominal pain, admits to dark tarry stools, but denies nausea, vomiting, constipation, dysuria.  Patient explains he has had hernia for a few months but recently it has gotten become more tenderness.  He denies alleviating or aggravating factors.  He has no known surgical history.  He denies headache, fever, chills, shortness of breath, chest pain, nausea, vomiting, diarrhea, pedal edema.  Past Medical History:  Diagnosis Date  . Arthritis   . Diabetes mellitus without complication (HCC)   . Hypertension     There are no problems to display for this patient.   History reviewed. No pertinent surgical history.     History reviewed. No pertinent family history.  Social History   Tobacco Use  . Smoking status: Never Smoker  . Smokeless tobacco: Never Used  Substance Use Topics  . Alcohol use: No  . Drug use: No    Home Medications Prior to Admission medications   Medication Sig Start Date End Date Taking? Authorizing Provider  acetaminophen (TYLENOL) 325 MG tablet Take 650 mg by mouth every 6 (six) hours as needed.    [provider]  amLODipine (NORVASC) 5 MG tablet  12/12/14   [provider]  benazepril (LOTENSIN) 40 MG tablet take 1 tablet by mouth twice a day 08/16/14   [provider]  chlorthalidone (HYGROTON) 25 MG tablet Take by mouth. 07/21/14  07/21/15  [provider]  docusate sodium (COLACE) 100 MG capsule Take 1 capsule (100 mg total) by mouth daily. 09/11/13   Purvis Sheffield, MD  glipiZIDE (GLUCOTROL XL) 10 MG 24 hr tablet Take by mouth. 11/17/14   [provider]  metFORMIN (GLUCOPHAGE) 1000 MG tablet take 1 and 1/2 tablets by mouth IN THE MORNING AND 1 TAB IN THE EVENING 05/19/14   [provider]  oxyCODONE-acetaminophen (PERCOCET) 5-325 MG per tablet Take 1 tablet by mouth every 4 (four) hours as needed. Patient not taking: Reported on 02/26/2016 09/11/13   Purvis Sheffield, MD  predniSONE (DELTASONE) 20 MG tablet Take 2 tablets (40 mg total) by mouth daily with breakfast. 02/26/16   McVeigh, Tawanna Cooler, PA  simvastatin (ZOCOR) 20 MG tablet  09/13/14   [provider]  tamsulosin (FLOMAX) 0.4 MG CAPS capsule Take by mouth. 02/17/14   [provider]  traMADol Janean Sark) 50 MG tablet Take 1/2 to 1 tablet TID 03/02/16   Ofilia Neas, PA-C    Allergies    Amoxicillin-pot clavulanate  Review of Systems   Review of Systems  Constitutional: Negative for chills and fever.  HENT: Negative for congestion.   Respiratory: Negative for shortness of breath.   Cardiovascular: Negative for chest pain.  Gastrointestinal: Positive for abdominal pain. Negative for diarrhea, nausea and vomiting.  Genitourinary: Negative for dysuria, enuresis and flank pain.  Musculoskeletal: Negative for back pain and myalgias.  Skin: Negative for rash.  Neurological: Negative for dizziness.  Hematological: Does not bruise/bleed easily.    Physical Exam Updated Vital Signs BP (!) 174/78 (BP Location: Left Arm)   Pulse 77   Temp 98.6 F (37 C) (Oral)   Resp 16   SpO2 98%   Physical Exam Vitals and nursing note reviewed.  Constitutional:      General: He is not in acute distress.    Appearance: He is not ill-appearing.  HENT:     Head: Normocephalic and atraumatic.     Nose: No congestion.      Mouth/Throat:     Mouth: Mucous membranes are moist.     Pharynx: Oropharynx is clear.  Eyes:     General: No scleral icterus. Cardiovascular:     Rate and Rhythm: Normal rate and regular rhythm.     Pulses: Normal pulses.     Heart sounds: No murmur heard.  No friction rub. No gallop.   Pulmonary:     Effort: No respiratory distress.     Breath sounds: No wheezing, rhonchi or rales.  Abdominal:     General: There is no distension.     Palpations: Abdomen is soft.     Tenderness: There is no abdominal tenderness. There is no guarding.     Comments: Patient's abdomen was visualized, nondistended but a right-sided inguinal hernia was visualized.  It was hard to the touch, nonerythematous, tender to palpation.  Was reducible.  Patient abdomen was nontender to palpation, negative Murphy sign, McBurney point, no rebound tenderness.  Musculoskeletal:        General: No swelling.     Right lower leg: No edema.     Left lower leg: No edema.  Skin:    General: Skin is warm and dry.     Capillary Refill: Capillary refill takes less than 2 seconds.     Findings: No rash.  Neurological:     Mental Status: He is alert.  Psychiatric:        Mood and Affect: Mood normal.     ED Results / Procedures / Treatments   Labs (all labs ordered are listed, but only abnormal results are displayed) Labs Reviewed  COMPREHENSIVE METABOLIC PANEL - Abnormal; Notable for the following components:      Result Value   Glucose, Bld 113 (*)    All other components within normal limits  LIPASE, BLOOD  CBC  URINALYSIS, ROUTINE W REFLEX MICROSCOPIC  LACTIC ACID, PLASMA  LACTIC ACID, PLASMA  POC OCCULT BLOOD, ED    EKG None  Radiology CT Abdomen Pelvis W Contrast  Result Date: 02/27/2020 CLINICAL DATA:  Right lower quadrant pain. EXAM: CT ABDOMEN AND PELVIS WITH CONTRAST TECHNIQUE: Multidetector CT imaging of the abdomen and pelvis was performed using the standard protocol following bolus  administration of intravenous contrast. CONTRAST:  OMNIPAQUE IOHEXOL 300 MG/ML  SOLN COMPARISON:  September 11, 2013 FINDINGS: Lower chest: No acute abnormality. Hepatobiliary: No focal liver abnormality is seen. No gallstones, gallbladder wall thickening, or biliary dilatation. Pancreas: Unremarkable. No pancreatic ductal dilatation or surrounding inflammatory changes. Spleen: Normal in size without focal abnormality. Adrenals/Urinary Tract: Adrenal glands are unremarkable. Kidneys are normal in size, without renal calculi or hydronephrosis. Subcentimeter bilateral renal cysts are seen. Bladder is unremarkable. Stomach/Bowel: There is a small hiatal hernia. Appendix appears normal. No evidence of bowel dilatation. Numerous diverticula are seen within the ascending colon. Mild focal thickening of the cecum is seen. Mild mesenteric inflammatory fat stranding is also noted along the anterior aspect of the  cecum. This inflammation extends inferiorly into a right inguinal hernia (see below). Vascular/Lymphatic: There is marked severity calcification of the abdominal aorta and bilateral common iliac arteries, without evidence of aneurysmal dilatation. No enlarged abdominal or pelvic lymph nodes. Reproductive: The prostate gland is markedly enlarged. Other: There is a 3.4 cm x 2.6 cm right inguinal hernia which contains fat and a mild amount of inflammatory fat stranding. A 2.2 cm x 2.3 cm fat containing left inguinal hernia is noted. Musculoskeletal: Moderate to marked severity degenerative changes are seen at the levels of L2-L3 and L3-L4. IMPRESSION: 1. Mild focal thickening of the cecum with mild mesenteric inflammatory fat stranding along the anterior aspect of the cecum. This inflammation extends inferiorly into a right inguinal hernia and represents sequelae associated with an underlying infectious or inflammatory process follow-up to resolution is recommended, as a cecal mass cannot be excluded. 2. Colonic  diverticulosis. 3. Small hiatal hernia. 4. Markedly enlarged prostate gland. 5. Bilateral fat containing inguinal hernias. 6. Aortic atherosclerosis. Aortic Atherosclerosis (ICD10-I70.0). Electronically Signed   By: Aram Candela M.D.   On: 02/27/2020 19:44    Procedures Procedures (including critical care time)  Medications Ordered in ED Medications  fentaNYL (SUBLIMAZE) injection 50 mcg (has no administration in time range)  morphine 2 MG/ML injection 2 mg (2 mg Intravenous Given 02/27/20 1853)  iohexol (OMNIPAQUE) 300 MG/ML solution 100 mL (100 mLs Intravenous Contrast Given 02/27/20 1926)    ED Course  I have reviewed the triage vital signs and the nursing notes.  Pertinent labs & imaging results that were available during my care of the patient were reviewed by me and considered in my medical decision making (see chart for details).    MDM Rules/Calculators/A&P                          I have personally reviewed all imaging, labs and have interpreted them.  Patient presents with right lower abdominal pain.  He was alert and oriented did not appear to be in acute distress, vital signs within normal limits.  On exam patient had lung sounds clear bilaterally, abdominal exam was performed showed a right inguinal hernia that was reducible, no signs of acute abdomen noted.  Will order screening labs and imaging for further evaluation.  He will be provided pain medication as well.  Initial labs show CMP without electrolyte abnormalities, no signs of AKI, UA does not show signs of UTI or pyelonephritis, lipase was 50, CBC does not show leukocytosis or signs of anemia.  Hemoccult was negative.  CT abdomen pelvis showed mild focal thickening of the cecum with mild mesenteric inflammation and fat stranding along the anterior aspect of the cecum, this inflammation extend inferiorly into the right inguinal hernia.  Bilateral inguinal hernias noted as well as diverticulosis, small hiatal hernia,  enlarged prostate, aortic sclerosis  21:00 abdomen was reassessed, nontender to palpation, right inguinal hernia was reduced fully, patient was in no distress states, he is feeling much better.  Due to abnormal finding on CT scan will consult general surgery for further evaluation and management.  22:17 Spoke with Dr. Cliffton Asters of general surgery he will come and evaluate the patient for further assessment and decide whether t patient needs to stay inpatient for further evaluation.  Patient will be admitted to the surgical floor for observation and further evaluation.  Low suspicion for systemic infection as patient is nontoxic-appearing, vital signs reassuring, no obvious source of infection noted  on exam, no leukocytosis seen on CBC.  Low suspicion for GI bleed as patient is not anemic, Hemoccult was negative.  Low suspicion for UTI or pyelonephritis as patient denies urinary symptoms, no CVA tenderness on exam, urinalysis did not show nitrates or leukocytes.  Low suspicion for pancreatitis as patient denies epigastric pain, nausea, vomiting, lipase was 50.  Low suspicion for cardiac abnormality as patient denies chest pain, shortness of breath, no signs of fluid overload or hypoperfusion noted on exam.  Patient care will be transferred over to surgical team for further evaluation and management.  Final Clinical Impression(s) / ED Diagnoses Final diagnoses:  Reducible right inguinal hernia    Rx / DC Orders ED Discharge Orders    None       Carroll SageFaulkner, Rosalyn Archambault J, PA-C 02/27/20 2324    Sabino DonovanKatz, Eric C, MD 02/28/20 [phone]

## 2020-02-27 NOTE — ED Notes (Signed)
Updated pt on wait and that he is the longest wait by time.  Thanked him for his patience and told him it shouldn't be much longer.  Also informed him to contact Mickle Asper that had called and spoke to someone wanting him to call.  He states that he called her.  NAD at this time.  Ambulatory without difficulty.

## 2020-02-27 NOTE — ED Triage Notes (Signed)
Pt arrived by [facility]. Reports RLQ pain for several days. Has been seen at [facility] for same and diagnosed with hernia but has not had followup appt yet and reports now having dark stools.

## 2020-02-27 NOTE — H&P (Signed)
CC: Inguinal hernia - incarcerated but now reduced  Requesting provider: Reola MosherFaulkner, PA-C  HPI: Gabriel Harrison is an 79 y.o. male hx HTN, DM, HLD who presented to the emergency department via EMS this morning at 6 AM.  He presented with right lower quadrant pain and reports he is being seen at [facility] for a hernia in this location.  He has not had follow-up appointments with [facility] yet.  He reports that he first noticed this bulge in the last day or 2.  He is a somewhat poor historian.  He does describe having some issues with his groin in the past however and is currently pending evaluation at [facility] for this reason.  He was seen in the emergency department.  He underwent work-up.  He was found to have a bulge in the right groin consistent with a hernia.  This was firm and hard but was ultimately able to be reduced by one of our ER providers.  He reports dramatic improvement in all of his symptoms since the reduction occurred.  He currently denies any abdominal pain or groin pain.   Afebrile with normal vitals.  Tulio Facundo blood cell count normal.  He subsequently had a CT scan done.  This demonstrated focal thickening of the cecum with mild mesenteric inflammatory fat stranding along the anterior aspect of the cecum.  There is also now a fat-containing right inguinal hernia.  Past Medical History:  Diagnosis Date  . Arthritis   . Diabetes mellitus without complication (HCC)   . Hypertension     History reviewed. No pertinent surgical history.  History reviewed. No pertinent family history.  Social:  reports that he has never smoked. He has never used smokeless tobacco. He reports that he does not drink alcohol and does not use drugs.  Allergies:  Allergies  Allergen Reactions  . Amoxicillin-Pot Clavulanate     Other reaction(s): Other (See Comments) Other Reaction: Other reaction    Medications: I have reviewed the patient's current medications.  Results for orders placed or performed during  the hospital encounter of 02/27/20 (from the past 48 hour(s))  Lipase, blood     Status: None   Collection Time: 02/27/20  6:19 AM  Result Value Ref Range   Lipase 50 11 - 51 U/L    Comment: Performed at [facility], [address] [place], [place] [address]  Comprehensive metabolic panel     Status: Abnormal   Collection Time: 02/27/20  6:19 AM  Result Value Ref Range   Sodium 140 135 - 145 mmol/L   Potassium 4.4 3.5 - 5.1 mmol/L   Chloride 102 98 - 111 mmol/L   CO2 27 22 - 32 mmol/L   Glucose, Bld 113 (H) 70 - 99 mg/dL    Comment: Glucose reference range applies only to samples taken after fasting for at least 8 hours.   BUN 9 8 - 23 mg/dL   Creatinine, Ser 6.041.01 0.61 - 1.24 mg/dL   Calcium 9.2 8.9 - 54.010.3 mg/dL   Total Protein 6.7 6.5 - 8.1 g/dL   Albumin 3.8 3.5 - 5.0 g/dL   AST 16 15 - 41 U/L   ALT 10 0 - 44 U/L   Alkaline Phosphatase 70 38 - 126 U/L   Total Bilirubin 0.6 0.3 - 1.2 mg/dL   GFR calc non Af Amer >60 >60 mL/min   GFR calc Af Amer >60 >60 mL/min   Anion gap 11 5 - 15    Comment: Performed at  [facility], [address] [place], [place], [place] [address]  CBC     Status: None   Collection Time: 02/27/20  6:19 AM  Result Value Ref Range   WBC 4.0 4.0 - 10.5 K/uL   RBC 4.80 4.22 - 5.81 MIL/uL   Hemoglobin 13.0 13.0 - 17.0 g/dL   HCT 60.4 39 - 52 %   MCV 83.5 80.0 - 100.0 fL   MCH 27.1 26.0 - 34.0 pg   MCHC 32.4 30.0 - 36.0 g/dL   RDW 54.0 98.1 - 19.1 %   Platelets 244 150 - 400 K/uL   nRBC 0.0 0.0 - 0.2 %    Comment: Performed at [facility], [address], [place], [place] [address]  Urinalysis, Routine w reflex microscopic Urine, Clean Catch     Status: None   Collection Time: 02/27/20  6:52 AM  Result Value Ref Range   Color, Urine YELLOW YELLOW   APPearance CLEAR CLEAR   Specific Gravity, Urine 1.020 1.005 - 1.030   pH 5.0 5.0 - 8.0   Glucose, UA NEGATIVE NEGATIVE mg/dL   Hgb urine dipstick NEGATIVE NEGATIVE   Bilirubin Urine  NEGATIVE NEGATIVE   Ketones, ur NEGATIVE NEGATIVE mg/dL   Protein, ur NEGATIVE NEGATIVE mg/dL   Nitrite NEGATIVE NEGATIVE   Leukocytes,Ua NEGATIVE NEGATIVE    Comment: Performed at [facility], [address], [place], [place] [address]  POC occult blood, ED     Status: None   Collection Time: 02/27/20  6:43 PM  Result Value Ref Range   Fecal Occult Bld NEGATIVE NEGATIVE    CT Abdomen Pelvis W Contrast  Result Date: 02/27/2020 CLINICAL DATA:  Right lower quadrant pain. EXAM: CT ABDOMEN AND PELVIS WITH CONTRAST TECHNIQUE: Multidetector CT imaging of the abdomen and pelvis was performed using the standard protocol following bolus administration of intravenous contrast. CONTRAST:  OMNIPAQUE IOHEXOL 300 MG/ML  SOLN COMPARISON:  September 11, 2013 FINDINGS: Lower chest: No acute abnormality. Hepatobiliary: No focal liver abnormality is seen. No gallstones, gallbladder wall thickening, or biliary dilatation. Pancreas: Unremarkable. No pancreatic ductal dilatation or surrounding inflammatory changes. Spleen: Normal in size without focal abnormality. Adrenals/Urinary Tract: Adrenal glands are unremarkable. Kidneys are normal in size, without renal calculi or hydronephrosis. Subcentimeter bilateral renal cysts are seen. Bladder is unremarkable. Stomach/Bowel: There is a small hiatal hernia. Appendix appears normal. No evidence of bowel dilatation. Numerous diverticula are seen within the ascending colon. Mild focal thickening of the cecum is seen. Mild mesenteric inflammatory fat stranding is also noted along the anterior aspect of the cecum. This inflammation extends inferiorly into a right inguinal hernia (see below). Vascular/Lymphatic: There is marked severity calcification of the abdominal aorta and bilateral common iliac arteries, without evidence of aneurysmal dilatation. No enlarged abdominal or pelvic lymph nodes. Reproductive: The prostate gland is markedly enlarged. Other: There is a 3.4 cm x  2.6 cm right inguinal hernia which contains fat and a mild amount of inflammatory fat stranding. A 2.2 cm x 2.3 cm fat containing left inguinal hernia is noted. Musculoskeletal: Moderate to marked severity degenerative changes are seen at the levels of L2-L3 and L3-L4. IMPRESSION: 1. Mild focal thickening of the cecum with mild mesenteric inflammatory fat stranding along the anterior aspect of the cecum. This inflammation extends inferiorly into a right inguinal hernia and represents sequelae associated with an underlying infectious or inflammatory process follow-up to resolution is recommended, as a cecal mass cannot be excluded. 2. Colonic  diverticulosis. 3. Small hiatal hernia. 4. Markedly enlarged prostate gland. 5. Bilateral fat containing inguinal hernias. 6. Aortic atherosclerosis. Aortic Atherosclerosis (ICD10-I70.0). Electronically Signed   By: Aram Candela M.D.   On: 02/27/2020 19:44    ROS - all of the below systems have been reviewed with the patient and positives are indicated with bold text General: chills, fever or night sweats Eyes: blurry vision or double vision ENT: epistaxis or sore throat Allergy/Immunology: itchy/watery eyes or nasal congestion Hematologic/Lymphatic: bleeding problems, blood clots or swollen lymph nodes Endocrine: temperature intolerance or unexpected weight changes Breast: new or changing breast lumps or nipple discharge Resp: cough, shortness of breath, or wheezing CV: chest pain or dyspnea on exertion GI: as per HPI GU: dysuria, trouble voiding, or hematuria MSK: joint pain or joint stiffness Neuro: TIA or stroke symptoms Derm: pruritus and skin lesion changes Psych: anxiety and depression  PE Blood pressure (!) 174/78, pulse 77, temperature 98.6 F (37 C), temperature source Oral, resp. rate 16, SpO2 98 %. Constitutional: NAD; conversant; no deformities Eyes: Moist conjunctiva; no lid lag; anicteric; PERRL Neck: Trachea midline; no  thyromegaly Lungs: Normal respiratory effort; no tactile fremitus CV: RRR; no palpable thrills; no pitting edema GI: Abd soft, nontender, nondistended; no palpable hepatosplenomegaly.  Small soft and reducible bilateral inguinal hernias, L > R. MSK: Normal range of motion of extremities; no clubbing/cyanosis Psychiatric: Appropriate affect; alert and oriented x3 Lymphatic: No palpable cervical or axillary lymphadenopathy  Results for orders placed or performed during the hospital encounter of 02/27/20 (from the past 48 hour(s))  Lipase, blood     Status: None   Collection Time: 02/27/20  6:19 AM  Result Value Ref Range   Lipase 50 11 - 51 U/L    Comment: Performed at [facility], [address], [place], [place] [address]  Comprehensive metabolic panel     Status: Abnormal   Collection Time: 02/27/20  6:19 AM  Result Value Ref Range   Sodium 140 135 - 145 mmol/L   Potassium 4.4 3.5 - 5.1 mmol/L   Chloride 102 98 - 111 mmol/L   CO2 27 22 - 32 mmol/L   Glucose, Bld 113 (H) 70 - 99 mg/dL    Comment: Glucose reference range applies only to samples taken after fasting for at least 8 hours.   BUN 9 8 - 23 mg/dL   Creatinine, Ser 1.88 0.61 - 1.24 mg/dL   Calcium 9.2 8.9 - 41.6 mg/dL   Total Protein 6.7 6.5 - 8.1 g/dL   Albumin 3.8 3.5 - 5.0 g/dL   AST 16 15 - 41 U/L   ALT 10 0 - 44 U/L   Alkaline Phosphatase 70 38 - 126 U/L   Total Bilirubin 0.6 0.3 - 1.2 mg/dL   GFR calc non Af Amer >60 >60 mL/min   GFR calc Af Amer >60 >60 mL/min   Anion gap 11 5 - 15    Comment: Performed at [facility], [address], [place], [place] [address]  CBC     Status: None   Collection Time: 02/27/20  6:19 AM  Result Value Ref Range   WBC 4.0 4.0 - 10.5 K/uL   RBC 4.80 4.22 - 5.81 MIL/uL   Hemoglobin 13.0 13.0 - 17.0 g/dL   HCT 16.0 39 - 52 %   MCV 83.5 80.0 - 100.0 fL   MCH 27.1 26.0 - 34.0 pg   MCHC 32.4 30.0 - 36.0 g/dL   RDW 13.6  11.5 - 15.5 %   Platelets 244 150 - 400 K/uL    nRBC 0.0 0.0 - 0.2 %    Comment: Performed at [facility], [address], [place], [place] [address]  Urinalysis, Routine w reflex microscopic Urine, Clean Catch     Status: None   Collection Time: 02/27/20  6:52 AM  Result Value Ref Range   Color, Urine YELLOW YELLOW   APPearance CLEAR CLEAR   Specific Gravity, Urine 1.020 1.005 - 1.030   pH 5.0 5.0 - 8.0   Glucose, UA NEGATIVE NEGATIVE mg/dL   Hgb urine dipstick NEGATIVE NEGATIVE   Bilirubin Urine NEGATIVE NEGATIVE   Ketones, ur NEGATIVE NEGATIVE mg/dL   Protein, ur NEGATIVE NEGATIVE mg/dL   Nitrite NEGATIVE NEGATIVE   Leukocytes,Ua NEGATIVE NEGATIVE    Comment: Performed at [facility], [address] [place], [place] [address]  POC occult blood, ED     Status: None   Collection Time: 02/27/20  6:43 PM  Result Value Ref Range   Fecal Occult Bld NEGATIVE NEGATIVE    CT Abdomen Pelvis W Contrast  Result Date: 02/27/2020 CLINICAL DATA:  Right lower quadrant pain. EXAM: CT ABDOMEN AND PELVIS WITH CONTRAST TECHNIQUE: Multidetector CT imaging of the abdomen and pelvis was performed using the standard protocol following bolus administration of intravenous contrast. CONTRAST:  OMNIPAQUE IOHEXOL 300 MG/ML  SOLN COMPARISON:  September 11, 2013 FINDINGS: Lower chest: No acute abnormality. Hepatobiliary: No focal liver abnormality is seen. No gallstones, gallbladder wall thickening, or biliary dilatation. Pancreas: Unremarkable. No pancreatic ductal dilatation or surrounding inflammatory changes. Spleen: Normal in size without focal abnormality. Adrenals/Urinary Tract: Adrenal glands are unremarkable. Kidneys are normal in size, without renal calculi or hydronephrosis. Subcentimeter bilateral renal cysts are seen. Bladder is unremarkable. Stomach/Bowel: There is a small hiatal hernia. Appendix appears normal. No evidence of bowel dilatation. Numerous diverticula are seen within the ascending colon. Mild focal thickening of the cecum  is seen. Mild mesenteric inflammatory fat stranding is also noted along the anterior aspect of the cecum. This inflammation extends inferiorly into a right inguinal hernia (see below). Vascular/Lymphatic: There is marked severity calcification of the abdominal aorta and bilateral common iliac arteries, without evidence of aneurysmal dilatation. No enlarged abdominal or pelvic lymph nodes. Reproductive: The prostate gland is markedly enlarged. Other: There is a 3.4 cm x 2.6 cm right inguinal hernia which contains fat and a mild amount of inflammatory fat stranding. A 2.2 cm x 2.3 cm fat containing left inguinal hernia is noted. Musculoskeletal: Moderate to marked severity degenerative changes are seen at the levels of L2-L3 and L3-L4. IMPRESSION: 1. Mild focal thickening of the cecum with mild mesenteric inflammatory fat stranding along the anterior aspect of the cecum. This inflammation extends inferiorly into a right inguinal hernia and represents sequelae associated with an underlying infectious or inflammatory process follow-up to resolution is recommended, as a cecal mass cannot be excluded. 2. Colonic diverticulosis. 3. Small hiatal hernia. 4. Markedly enlarged prostate gland. 5. Bilateral fat containing inguinal hernias. 6. Aortic atherosclerosis. Aortic Atherosclerosis (ICD10-I70.0). Electronically Signed   By: Aram Candela M.D.   On: 02/27/2020 19:44   A/P: Walther Sanagustin is an 79 y.o. male with hx HTN, DM, HLD - here with what sounds like a recently incarcerated inguinal hernia that was able to be reduced by ED-P  -Given CT findings with some thickening of the cecum, I believe it would be best that he be admitted for at  least observation overnight.  Repeat abdominal exam tomorrow. -I believe ultimately he will need to have these hernias fixed.  Given that they are bilateral, may benefit from a laparoscopic inguinal hernia repair. -I reviewed everything with him including plan moving forward. He  is in agreement with all the above.  Stephanie Coup. Cliffton Asters, M.D. [facility] Use [email or web address] to contact on call provider

## 2020-02-27 NOTE — ED Notes (Signed)
Pt to CT via stretcher

## 2020-02-28 DIAGNOSIS — K4090 Unilateral inguinal hernia, without obstruction or gangrene, not specified as recurrent: Secondary | ICD-10-CM

## 2020-02-28 LAB — CBC WITH DIFFERENTIAL/PLATELET
Abs Immature Granulocytes: 0.01 10*3/uL (ref 0.00–0.07)
Basophils Absolute: 0 10*3/uL (ref 0.0–0.1)
Basophils Relative: 1 %
Eosinophils Absolute: 0.1 10*3/uL (ref 0.0–0.5)
Eosinophils Relative: 2 %
HCT: 39.4 % (ref 39.0–52.0)
Hemoglobin: 12.7 g/dL — ABNORMAL LOW (ref 13.0–17.0)
Immature Granulocytes: 0 %
Lymphocytes Relative: 21 %
Lymphs Abs: 0.8 10*3/uL (ref 0.7–4.0)
MCH: 26.7 pg (ref 26.0–34.0)
MCHC: 32.2 g/dL (ref 30.0–36.0)
MCV: 82.9 fL (ref 80.0–100.0)
Monocytes Absolute: 0.4 10*3/uL (ref 0.1–1.0)
Monocytes Relative: 11 %
Neutro Abs: 2.5 10*3/uL (ref 1.7–7.7)
Neutrophils Relative %: 65 %
Platelets: 215 10*3/uL (ref 150–400)
RBC: 4.75 MIL/uL (ref 4.22–5.81)
RDW: 13.4 % (ref 11.5–15.5)
WBC: 3.8 10*3/uL — ABNORMAL LOW (ref 4.0–10.5)
nRBC: 0 % (ref 0.0–0.2)

## 2020-02-28 LAB — CBG MONITORING, ED: Glucose-Capillary: 188 mg/dL — ABNORMAL HIGH (ref 70–99)

## 2020-02-28 LAB — SARS CORONAVIRUS 2 BY RT PCR (HOSPITAL ORDER, PERFORMED IN ~~LOC~~ HOSPITAL LAB): SARS Coronavirus 2: NEGATIVE

## 2020-02-28 LAB — LACTIC ACID, PLASMA: Lactic Acid, Venous: 1.4 mmol/L (ref 0.5–1.9)

## 2020-02-28 MED ORDER — ACETAMINOPHEN 500 MG PO TABS
1000.0000 mg | ORAL_TABLET | Freq: Four times a day (QID) | ORAL | Status: DC
  Administered 2020-02-28: 1000 mg via ORAL
  Filled 2020-02-28: qty 2

## 2020-02-28 MED ORDER — METFORMIN HCL 500 MG PO TABS: 1000.0000 mg | ORAL_TABLET | Freq: Two times a day (BID) | ORAL | Status: DC

## 2020-02-28 MED ORDER — ONDANSETRON 4 MG PO TBDP: 4.0000 mg | ORAL_TABLET | Freq: Four times a day (QID) | ORAL | Status: DC | PRN

## 2020-02-28 MED ORDER — GLIPIZIDE ER 10 MG PO TB24
10.0000 mg | ORAL_TABLET | Freq: Every day | ORAL | Status: DC
  Filled 2020-02-28: qty 1

## 2020-02-28 MED ORDER — AMLODIPINE BESYLATE 5 MG PO TABS: 5.0000 mg | ORAL_TABLET | Freq: Every day | ORAL | Status: DC

## 2020-02-28 MED ORDER — PREDNISONE 20 MG PO TABS: 40.0000 mg | ORAL_TABLET | Freq: Every day | ORAL | Status: DC

## 2020-02-28 MED ORDER — TAMSULOSIN HCL 0.4 MG PO CAPS: 0.4000 mg | ORAL_CAPSULE | Freq: Every day | ORAL | Status: DC

## 2020-02-28 MED ORDER — HEPARIN SODIUM (PORCINE) 5000 UNIT/ML IJ SOLN
5000.0000 [IU] | Freq: Three times a day (TID) | INTRAMUSCULAR | Status: DC
  Administered 2020-02-28: 5000 [IU] via SUBCUTANEOUS
  Filled 2020-02-28: qty 1

## 2020-02-28 MED ORDER — BENAZEPRIL HCL 40 MG PO TABS
40.0000 mg | ORAL_TABLET | Freq: Two times a day (BID) | ORAL | Status: DC
  Administered 2020-02-28: 40 mg via ORAL
  Filled 2020-02-28 (×2): qty 1

## 2020-02-28 MED ORDER — SIMETHICONE 80 MG PO CHEW: 40.0000 mg | CHEWABLE_TABLET | Freq: Four times a day (QID) | ORAL | Status: DC | PRN

## 2020-02-28 MED ORDER — DOCUSATE SODIUM 100 MG PO CAPS: 100.0000 mg | ORAL_CAPSULE | Freq: Every day | ORAL | Status: DC

## 2020-02-28 MED ORDER — TRAMADOL HCL 50 MG PO TABS: 50.0000 mg | ORAL_TABLET | Freq: Four times a day (QID) | ORAL | Status: DC | PRN

## 2020-02-28 MED ORDER — DIPHENHYDRAMINE HCL 50 MG/ML IJ SOLN: 12.5000 mg | Freq: Four times a day (QID) | INTRAMUSCULAR | Status: DC | PRN

## 2020-02-28 MED ORDER — HYDROMORPHONE HCL 1 MG/ML IJ SOLN: 0.5000 mg | INTRAMUSCULAR | Status: DC | PRN

## 2020-02-28 MED ORDER — DIPHENHYDRAMINE HCL 12.5 MG/5ML PO ELIX: 12.5000 mg | ORAL_SOLUTION | Freq: Four times a day (QID) | ORAL | Status: DC | PRN

## 2020-02-28 MED ORDER — ONDANSETRON HCL 4 MG/2ML IJ SOLN: 4.0000 mg | Freq: Four times a day (QID) | INTRAMUSCULAR | Status: DC | PRN

## 2020-02-28 MED ORDER — SIMVASTATIN 20 MG PO TABS: 20.0000 mg | ORAL_TABLET | Freq: Every day | ORAL | Status: DC

## 2020-02-28 MED ORDER — POLYETHYLENE GLYCOL 3350 17 G PO PACK: 17.0000 g | PACK | Freq: Every day | ORAL | 0 refills | Status: DC | PRN

## 2020-02-28 NOTE — Discharge Instructions (Signed)

## 2020-02-28 NOTE — Discharge Summary (Signed)
Patient ID: Nas Wafer [id number] 09-Feb-1941 79 y.o.  Admit date: 02/27/2020 Discharge date: 02/28/2020  Admitting Diagnosis: Recently incarcerated inguinal hernia that was able to be reduced by EDP  Discharge Diagnosis Patient Active Problem List   Diagnosis Date Noted  . Inguinal hernia 02/28/2020    Consultants None  Procedures None  Hospital Course:  Marguerite Jarboe is an 79 y.o. male hx HTN, DM, HLD who presented to the emergency department via EMS for RLQ abdominal pain. Patient was found to have a bulge in the right groin consistent with a hernia.  This was firm and hard but was ultimately able to be reduced by one of our ER providers.  He reported dramatic improvement in all of his symptoms after the reduction occurred. He subsequently had a CT scan done that demonstrated focal thickening of the cecum with mild mesenteric inflammatory fat stranding along the anterior aspect of the cecum.  There is also now a fat-containing right inguinal hernia. He was monitored overnight. Repeat labs in the AM were without leukocytosis. The patient was tolerating a diet and denied any abdominal pain, n/v, or recurrence of his symptoms. His hernia remained reduced on exam. Patient was discharged home. Return precautions discussed. Patient has been scheduled a follow up appointment to discuss elective repair as an outpatient.    Physical Exam: Gen:  Alert, NAD, pleasant Card:  RRR Pulm:  CTAB, no W/R/R, effort normal Abd: Soft, NT/ND, +BS, Reduced bilateral inguinal hernia's that are NT to palpation.  Ext:  No LE edema  Psych: A&Ox3  Skin: no rashes noted, warm and dry  Allergies as of 02/28/2020      Reactions   Amoxicillin-pot Clavulanate    Other reaction(s): Other (See Comments) Other Reaction: Other reaction      Medication List    STOP taking these medications   oxyCODONE-acetaminophen 5-325 MG tablet Commonly known as: Percocet   predniSONE 20 MG tablet Commonly  known as: DELTASONE     TAKE these medications   acetaminophen 325 MG tablet Commonly known as: TYLENOL Take 650 mg by mouth every 6 (six) hours as needed for mild pain or fever.   amLODipine 5 MG tablet Commonly known as: NORVASC Take 5 mg by mouth daily.   benazepril 40 MG tablet Commonly known as: LOTENSIN Take 40 mg by mouth 2 (two) times daily.   docusate sodium 100 MG capsule Commonly known as: COLACE Take 1 capsule (100 mg total) by mouth daily.   glipiZIDE 5 MG 24 hr tablet Commonly known as: GLUCOTROL XL Take 5 mg by mouth daily.   metFORMIN 1000 MG tablet Commonly known as: GLUCOPHAGE Take 500-750 mg by mouth See admin instructions. Take  1 and 1/2 tablets by mouth in the morning and take 1 tablet in the evening   polyethylene glycol 17 g packet Commonly known as: MIRALAX / GLYCOLAX Take 17 g by mouth daily as needed.   simvastatin 20 MG tablet Commonly known as: ZOCOR Take 20 mg by mouth daily at 6 PM.   tamsulosin 0.4 MG Caps capsule Commonly known as: FLOMAX Take 0.4 mg by mouth daily after supper.   traMADol 50 MG tablet Commonly known as: ULTRAM Take 1/2 to 1 tablet TID What changed:   how much to take  how to take this  when to take this  additional instructions         Follow-up Information    Axel Filler, MD. Go on 03/16/2020.   Specialty: General  Surgery Why: 950am. Please arrive to your appointment 30 minutes early for paperwork. Please bring a copy of your photo id and insurance card.  Contact information: [address] [place] [address] [phone]               Signed: Leary Roca, [facility] 02/28/2020, 8:54 AM Please see Amion for pager number during day hours 7:00am-4:30pm

## 2020-02-28 NOTE — ED Notes (Signed)
Pt's CBG result was 188. Informed Samantha - RN.

## 2020-03-02 ENCOUNTER — Encounter (HOSPITAL_COMMUNITY): Payer: Self-pay | Admitting: Emergency Medicine

## 2020-03-02 ENCOUNTER — Emergency Department (HOSPITAL_COMMUNITY)
Admission: EM | Admit: 2020-03-02 | Discharge: 2020-03-03 | Disposition: A | Discharge status: Home / Self Care | Payer: Medicare (Managed Care) | Attending: Emergency Medicine | Admitting: Emergency Medicine

## 2020-03-02 ENCOUNTER — Other Ambulatory Visit: Payer: Self-pay

## 2020-03-02 DIAGNOSIS — Z79899 Other long term (current) drug therapy: Secondary | ICD-10-CM | POA: Diagnosis not present

## 2020-03-02 DIAGNOSIS — Z7984 Long term (current) use of oral hypoglycemic drugs: Secondary | ICD-10-CM | POA: Insufficient documentation

## 2020-03-02 DIAGNOSIS — R109 Unspecified abdominal pain: Secondary | ICD-10-CM | POA: Diagnosis present

## 2020-03-02 DIAGNOSIS — K4090 Unilateral inguinal hernia, without obstruction or gangrene, not specified as recurrent: Secondary | ICD-10-CM

## 2020-03-02 LAB — COMPREHENSIVE METABOLIC PANEL
ALT: 12 U/L (ref 0–44)
AST: 16 U/L (ref 15–41)
Albumin: 4.1 g/dL (ref 3.5–5.0)
Alkaline Phosphatase: 72 U/L (ref 38–126)
Anion gap: 10 (ref 5–15)
BUN: 11 mg/dL (ref 8–23)
CO2: 27 mmol/L (ref 22–32)
Calcium: 9.4 mg/dL (ref 8.9–10.3)
Chloride: 103 mmol/L (ref 98–111)
Creatinine, Ser: 1.04 mg/dL (ref 0.61–1.24)
GFR calc Af Amer: >60 mL/min (ref >60)
GFR calc non Af Amer: >60 mL/min (ref >60)
Glucose, Bld: 153 mg/dL — ABNORMAL HIGH (ref 70–99)
Potassium: 4.2 mmol/L (ref 3.5–5.1)
Sodium: 140 mmol/L (ref 135–145)
Total Bilirubin: 0.6 mg/dL (ref 0.3–1.2)
Total Protein: 6.9 g/dL (ref 6.5–8.1)

## 2020-03-02 LAB — URINALYSIS, ROUTINE W REFLEX MICROSCOPIC
Bacteria, UA: NONE SEEN
Bilirubin Urine: NEGATIVE
Glucose, UA: NEGATIVE mg/dL
Hgb urine dipstick: NEGATIVE
Ketones, ur: NEGATIVE mg/dL
Leukocytes,Ua: NEGATIVE
Nitrite: NEGATIVE
Protein, ur: 30 mg/dL — AB
Specific Gravity, Urine: 1.028 (ref 1.005–1.030)
pH: 5 (ref 5.0–8.0)

## 2020-03-02 LAB — CBC
HCT: 41.2 % (ref 39.0–52.0)
Hemoglobin: 13.1 g/dL (ref 13.0–17.0)
MCH: 26.6 pg (ref 26.0–34.0)
MCHC: 31.8 g/dL (ref 30.0–36.0)
MCV: 83.6 fL (ref 80.0–100.0)
Platelets: 268 10*3/uL (ref 150–400)
RBC: 4.93 MIL/uL (ref 4.22–5.81)
RDW: 13.6 % (ref 11.5–15.5)
WBC: 4.4 10*3/uL (ref 4.0–10.5)
nRBC: 0 % (ref 0.0–0.2)

## 2020-03-02 LAB — LIPASE, BLOOD: Lipase: 50 U/L (ref 11–51)

## 2020-03-02 NOTE — ED Triage Notes (Signed)
Pt c/o RLQ pain, pt has known hernia, states pain increased today and he has noticed green stools.

## 2020-03-03 NOTE — Discharge Instructions (Signed)
If your pain recurs please lay back flat and try to gently push it back into place. If you try this for an hour and it does not improve or if you develop vomiting then please return to the ED for evaluation.

## 2020-03-03 NOTE — ED Provider Notes (Addendum)
[facility] EMERGENCY DEPARTMENT Provider Note   CSN: [id number] Arrival date & time: 03/02/20  2139     History Chief Complaint  Patient presents with  . Abdominal Pain    Gabriel Harrison is a 79 y.o. male.  79 yo M with a chief complaints of right inguinal pain. This was really severe couple days ago. Has resolved since he has been sitting in the ED for about 12 hours now. Patient was seen the day before that for a right inguinal hernia that was reduced at bedside. He has scheduled follow-up with general surgery on September 9. They actually have told him that they will call him if appointment opens up so he can try and be seen sooner. He felt like he has been constipated as he has not had a bowel movement since yesterday. He felt that his stool was also very strange consistency yesterday. No vomiting able to eat and drink. No fevers.  The history is provided by the patient.  Abdominal Pain Pain location:  RLQ Pain quality: aching   Pain radiates to:  Does not radiate Pain severity:  Moderate Onset quality:  Gradual Duration:  2 days Timing:  Constant Progression:  Partially resolved Chronicity:  Recurrent Relieved by:  Nothing Worsened by:  Nothing Ineffective treatments:  None tried Associated symptoms: no chest pain, no chills, no diarrhea, no fever, no shortness of breath and no vomiting        Past Medical History:  Diagnosis Date  . Arthritis   . Diabetes mellitus without complication (HCC)   . Hypertension     Patient Active Problem List   Diagnosis Date Noted  . Inguinal hernia 02/28/2020    History reviewed. No pertinent surgical history.     No family history on file.  Social History   Tobacco Use  . Smoking status: Never Smoker  . Smokeless tobacco: Never Used  Substance Use Topics  . Alcohol use: No  . Drug use: No    Home Medications Prior to Admission medications   Medication Sig Start Date End Date Taking?  Authorizing Provider  acetaminophen (TYLENOL) 325 MG tablet Take 650 mg by mouth every 6 (six) hours as needed for mild pain or fever.     [provider]  amLODipine (NORVASC) 5 MG tablet Take 5 mg by mouth daily.  12/12/14   [provider]  benazepril (LOTENSIN) 40 MG tablet Take 40 mg by mouth 2 (two) times daily.  08/16/14   [provider]  docusate sodium (COLACE) 100 MG capsule Take 1 capsule (100 mg total) by mouth daily. Patient not taking: Reported on 02/27/2020 09/11/13   Purvis Sheffield, MD  glipiZIDE (GLUCOTROL XL) 5 MG 24 hr tablet Take 5 mg by mouth daily. 01/04/20   [provider]  metFORMIN (GLUCOPHAGE) 1000 MG tablet Take 500-750 mg by mouth See admin instructions. Take  1 and 1/2 tablets by mouth in the morning and take 1 tablet in the evening 05/19/14   [provider]  polyethylene glycol (MIRALAX / GLYCOLAX) 17 g packet Take 17 g by mouth daily as needed. 02/28/20   Maczis, Elmer Sow, PA-C  simvastatin (ZOCOR) 20 MG tablet Take 20 mg by mouth daily at 6 PM.  09/13/14   [provider]  tamsulosin (FLOMAX) 0.4 MG CAPS capsule Take 0.4 mg by mouth daily after supper.  02/17/14   [provider]  traMADol (ULTRAM) 50 MG tablet Take 1/2 to 1 tablet TID Patient  taking differently: Take 25-50 mg by mouth in the morning, at noon, and at bedtime.  03/02/16   Ofilia Neas, PA-C    Allergies    Amoxicillin-pot clavulanate  Review of Systems   Review of Systems  Constitutional: Negative for chills and fever.  HENT: Negative for congestion and facial swelling.   Eyes: Negative for discharge and visual disturbance.  Respiratory: Negative for shortness of breath.   Cardiovascular: Negative for chest pain and palpitations.  Gastrointestinal: Positive for abdominal pain. Negative for diarrhea and vomiting.  Musculoskeletal: Negative for arthralgias and myalgias.  Skin: Negative for color change and rash.  Neurological:  Negative for tremors, syncope and headaches.  Psychiatric/Behavioral: Negative for confusion and dysphoric mood.    Physical Exam Updated Vital Signs BP 137/68 (BP Location: Left Arm)   Pulse 66   Temp 98.2 F (36.8 C) (Oral)   Resp 20   Ht 5\' 9"  (1.753 m)   SpO2 100%   BMI 28.94 kg/m   Physical Exam Vitals and nursing note reviewed.  Constitutional:      Appearance: He is well-developed.  HENT:     Head: Normocephalic and atraumatic.  Eyes:     Pupils: Pupils are equal, round, and reactive to light.  Neck:     Vascular: No JVD.  Cardiovascular:     Rate and Rhythm: Normal rate and regular rhythm.     Heart sounds: No murmur heard.  No friction rub. No gallop.   Pulmonary:     Effort: No respiratory distress.     Breath sounds: No wheezing.  Abdominal:     General: There is no distension.     Tenderness: There is no guarding or rebound.  Genitourinary:    Comments: Right inguinal hernia Musculoskeletal:        General: Normal range of motion.     Cervical back: Normal range of motion and neck supple.  Skin:    Coloration: Skin is not pale.     Findings: No rash.  Neurological:     Mental Status: He is alert and oriented to person, place, and time.  Psychiatric:        Behavior: Behavior normal.     ED Results / Procedures / Treatments   Labs (all labs ordered are listed, but only abnormal results are displayed) Labs Reviewed  COMPREHENSIVE METABOLIC PANEL - Abnormal; Notable for the following components:      Result Value   Glucose, Bld 153 (*)    All other components within normal limits  URINALYSIS, ROUTINE W REFLEX MICROSCOPIC - Abnormal; Notable for the following components:   Protein, ur 30 (*)    All other components within normal limits  LIPASE, BLOOD  CBC    EKG None  Radiology No results found.  Procedures Hernia reduction  Date/Time: 03/03/2020 9:24 AM Performed by: 03/05/2020, DO Authorized by: Melene Plan, DO  Preparation:  Patient was prepped and draped in the usual sterile fashion. Local anesthesia used: no  Anesthesia: Local anesthesia used: no  Sedation: Patient sedated: no  Patient tolerance: patient tolerated the procedure well with no immediate complications    (including critical care time)  Medications Ordered in ED Medications - No data to display  ED Course  I have reviewed the triage vital signs and the nursing notes.  Pertinent labs & imaging results that were available during my care of the patient were reviewed by me and considered in my medical decision making (see chart for details).  MDM Rules/Calculators/A&P                          79 yo M with a chief complaints of right inguinal pain. Hernia reduced at bedside. We will have him follow-up with general surgery in the office.  9:25 AM:  I have discussed the diagnosis/risks/treatment options with the patient and believe the pt to be eligible for discharge home to follow-up with PCP. We also discussed returning to the ED immediately if new or worsening sx occur. We discussed the sx which are most concerning (e.g., sudden worsening pain, fever, inability to tolerate by mouth) that necessitate immediate return. Medications administered to the patient during their visit and any new prescriptions provided to the patient are listed below.  Medications given during this visit Medications - No data to display   The patient appears reasonably screen and/or stabilized for discharge and I doubt any other medical condition or other [facility] requiring further screening, evaluation, or treatment in the ED at this time prior to discharge.   Final Clinical Impression(s) / ED Diagnoses Final diagnoses:  Right inguinal hernia    Rx / DC Orders ED Discharge Orders    None       Melene Plan, DO 03/03/20 0924    Melene Plan, DO 03/03/20 [phone]

## 2020-03-16 ENCOUNTER — Ambulatory Visit: Payer: Self-pay | Admitting: General Surgery

## 2020-03-16 NOTE — H&P (Signed)
°  History of Present Illness Axel Filler MD; 03/16/2020 10:17 AM) The patient is a 79 year old male who presents with an inguinal hernia. Referred by: Dr. Adela Lank Chief Complaint: Right inguinal hernia  Patient is an 79 year old male, with history of hypertension and diabetes, who comes in secondary to a right inguinal hernia. He states this is been there for several years. He states he [place] the hernia [place]. He states that recently he's had more issues recently with a hernia. He states that it is causing him to go to the ER twice. This was reduced in the ER by the M.D. Patient with CT scan and it appears that the cecum was near the hernia and likely the cause of the incarceration.  Patient's had no previous abdominal surgery.     Allergies (Tanisha A. Manson Passey, RMA; 03/16/2020 10:02 AM) No Known Drug Allergies  [03/16/2020]: Allergies Reconciled   Medication History (Tanisha A. Manson Passey, RMA; 03/16/2020 10:02 AM) traMADol HCl (50MG  Tablet, Oral) Active. amLODIPine Besylate (5MG  Tablet, Oral) Active. Benazepril HCl (40MG  Tablet, Oral) Active. Furosemide (20MG  Tablet, Oral) Active. glipiZIDE ER (5MG  Tablet ER 24HR, Oral) Active. metFORMIN HCl (1000MG  Tablet, Oral) Active. Simvastatin (20MG  Tablet, Oral) Active. Tamsulosin HCl (0.4MG  Capsule, Oral) Active.    Review of Systems , MD; 03/16/2020 10:18 AM) All other systems negative  Vitals ([name] RMA; 03/16/2020 10:03 AM) 03/16/2020 10:02 AM Weight: 196 lb Height: 71in Body Surface Area: 2.09 m Body Mass Index: 27.34 kg/m  Temp.: 98.43F  Pulse: 98 (Regular)  BP: 142/82(Sitting, Left Arm, Standard)       Physical Exam MD; 03/16/2020 10:17 AM) The physical exam findings are as follows: Note: Constitutional: No acute distress, conversant, appears stated age  Eyes: Anicteric sclerae, moist conjunctiva, no lid lag  Neck: No  thyromegaly, trachea midline, no cervical lymphadenopathy  Lungs: Clear to auscultation biilaterally, normal respiratory effot  Cardiovascular: regular rate & rhythm, no murmurs, no peripheal edema, pedal pulses 2+  GI: Soft, no masses or hepatosplenomegaly, non-tender to palpation  MSK: Normal gait, no clubbing cyanosis, edema  Skin: No rashes, palpation reveals normal skin turgor  Psychiatric: Appropriate judgment and insight, oriented to person, place, and time  Abdomen Inspection Hernias - Right - Inguinal hernia - Reducible - Right.    Assessment & Plan MD; 03/16/2020 10:18 AM) RIGHT INGUINAL HERNIA (K40.90) Impression: 79 year old male with a history of hypertension, diabetes, and a right inguinal hernia. 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  1. The patient will like to proceed to the operating room for laparoscopic right inguinal hernia repair with mesh.  2. I discussed with the patient the signs and symptoms of incarceration and strangulation and the need to proceed to the ER should they occur.  3. I discussed with the patient the risks and benefits of the procedure to include but not limited to: Infection, bleeding, damage to surrounding structures, possible need for further surgery, possible nerve pain, and possible recurrence. The patient was understanding and wishes to proceed.

## 2020-03-16 NOTE — H&P (View-Only) (Signed)
  History of Present Illness (Gabriel Williamsen MD; 03/16/2020 10:17 AM) °The patient is a 79 year old male who presents with an inguinal hernia. Referred by: Dr. [name] °Chief Complaint: Right inguinal hernia ° °Patient is an 79-year-old male, with history of hypertension and diabetes, who comes in secondary to a right inguinal hernia.  He states this is been there for several years.  He states he noticed the hernia when he is to try [place] the [place] office. °He states that recently he's had more issues recently with a hernia.  He states that it is causing him to go to the ER twice.  This was reduced in the ER by the M.D.  Patient with CT scan and it appears that the cecum was near the hernia and likely the cause of the incarceration. ° °Patient's had no previous abdominal surgery. ° ° ° ° °Allergies ([name], RMA; 03/16/2020 10:02 AM) °No Known Drug Allergies   [03/16/2020]: °Allergies Reconciled   ° °Medication History ([name], RMA; 03/16/2020 10:02 AM) °traMADol HCl  (50MG Tablet, Oral) Active. °amLODIPine Besylate  (5MG Tablet, Oral) Active. °Benazepril HCl  (40MG Tablet, Oral) Active. °Furosemide  (20MG Tablet, Oral) Active. °glipiZIDE ER  (5MG Tablet ER 24HR, Oral) Active. °metFORMIN HCl  (1000MG Tablet, Oral) Active. °Simvastatin  (20MG Tablet, Oral) Active. °Tamsulosin HCl  (0.4MG Capsule, Oral) Active. ° ° ° °Review of Systems ([name], MD; 03/16/2020 10:18 AM) °All other systems negative ° °Vitals ([name] RMA; 03/16/2020 10:03 AM) °03/16/2020 10:02 AM °Weight: 196 lb   Height: 71 in  °Body Surface Area: 2.09 m²   Body Mass Index: 27.34 kg/m²   °Temp.: 98.2° F    Pulse: 98 (Regular)    °BP: 142/82(Sitting, Left Arm, Standard) ° ° ° ° ° ° °Physical Exam ([name] MD; 03/16/2020 10:17 AM) °The physical exam findings are as follows: °Note:   Constitutional: No acute distress, conversant, appears stated age ° °Eyes: Anicteric sclerae, moist conjunctiva, no lid lag ° °Neck: No  thyromegaly, trachea midline, no cervical lymphadenopathy ° °Lungs: Clear to auscultation biilaterally, normal respiratory effot ° °Cardiovascular: regular rate & rhythm, no murmurs, no peripheal edema, pedal pulses 2+ ° °GI: Soft, no masses or hepatosplenomegaly, non-tender to palpation ° °MSK: Normal gait, no clubbing cyanosis, edema ° °Skin: No rashes, palpation reveals normal skin turgor ° °Psychiatric: Appropriate judgment and insight, oriented to person, place, and time ° °Abdomen °Inspection °Hernias - Right - Inguinal hernia - Reducible - Right. ° ° ° °Assessment & Plan ([name] MD; 03/16/2020 10:18 AM) °RIGHT INGUINAL HERNIA (K40.90) °Impression: 79-year-old male with a history of hypertension, diabetes, and a right inguinal hernia. 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 ° °1. The patient will like to proceed to the operating room for laparoscopic right inguinal hernia repair with mesh. ° °2. I discussed with the patient the signs and symptoms of incarceration and strangulation and the need to proceed to the ER should they occur. ° °3. I discussed with the patient the risks and benefits of the procedure to include but not limited to: Infection, bleeding, damage to surrounding structures, possible need for further surgery, possible nerve pain, and possible recurrence. The patient was understanding and wishes to proceed. °

## 2020-03-22 NOTE — Progress Notes (Signed)
Your procedure is scheduled on Wednesday, March 29, 2020.  Report to [facility] Main Entrance "A" at 12:30 P.M., and check in at the Admitting office.  Call this number if you have problems the morning of surgery:  [phone]  Call [phone] if you have any questions prior to your surgery date Monday-Friday 8am-4pm    Remember:  Do not eat after midnight the night before your surgery  You may drink clear liquids until 11:30 AM the morning of your surgery.   Clear liquids allowed are: Water, Non-Citrus Juices (without pulp), Carbonated Beverages, Clear Tea, Black Coffee Only, and Gatorade    Take these medicines the morning of surgery with A SIP OF WATER:  amLODipine (NORVASC) simvastatin (ZOCOR) tamsulosin (FLOMAX) acetaminophen (TYLENOL) - if needed traMADol (ULTRAM) - if needed  As of today, STOP taking any Aspirin (unless otherwise instructed by your surgeon) Aleve, Naproxen, Ibuprofen, Motrin, Advil, Goody's, BC's, all herbal medications, fish oil, and all vitamins.   WHAT DO I DO ABOUT MY DIABETES MEDICATION?   Marland Kitchen Do not take oral diabetes medicines (glipiZIDE (GLUCOTROL XL) OR metFORMIN (GLUCOPHAGE) ) the morning of surgery.   HOW TO MANAGE YOUR DIABETES BEFORE AND AFTER SURGERY  Why is it important to control my blood sugar before and after surgery? . Improving blood sugar levels before and after surgery helps healing and can limit problems. . A way of improving blood sugar control is eating a healthy diet by: o  Eating less sugar and carbohydrates o  Increasing activity/exercise o  Talking with your doctor about reaching your blood sugar goals . High blood sugars (greater than 180 mg/dL) can raise your risk of infections and slow your recovery, so you will need to focus on controlling your diabetes during the weeks before surgery. . Make sure that the doctor who takes care of your diabetes knows about your planned surgery including the date and  location.  How do I manage my blood sugar before surgery? . Check your blood sugar at least 4 times a day, starting 2 days before surgery, to make sure that the level is not too high or low. . Check your blood sugar the morning of your surgery when you wake up and every 2 hours until you get to the Short Stay unit. o If your blood sugar is less than 70 mg/dL, you will need to treat for low blood sugar: - Do not take insulin. - Treat a low blood sugar (less than 70 mg/dL) with  cup of clear juice (cranberry or apple), 4 glucose tablets, OR glucose gel. - Recheck blood sugar in 15 minutes after treatment (to make sure it is greater than 70 mg/dL). If your blood sugar is not greater than 70 mg/dL on recheck, call [phone] for further instructions. . Report your blood sugar to the short stay nurse when you get to Short Stay.  . If you are admitted to the hospital after surgery: o Your blood sugar will be checked by the staff and you will probably be given insulin after surgery (instead of oral diabetes medicines) to make sure you have good blood sugar levels. o The goal for blood sugar control after surgery is 80-180 mg/dL.                      Do not wear jewelry.            Do not wear lotions, powders, colognes, or deodorant.  Men may shave face and neck.            Do not bring valuables to the hospital.            [facility] is not responsible for any belongings or valuables.  Do NOT Smoke (Tobacco/Vaping) or drink Alcohol 24 hours prior to your procedure If you use a CPAP at night, you may bring all equipment for your overnight stay.   Contacts, glasses, dentures or bridgework may not be worn into surgery.      For patients admitted to the hospital, discharge time will be determined by your treatment team.   Patients discharged the day of surgery will not be allowed to drive home, and someone needs to stay with them for 24 hours.    Special instructions:   [facility]- Preparing For Surgery  Before surgery, you can play an important role. Because skin is not sterile, your skin needs to be as free of germs as possible. You can reduce the number of germs on your skin by washing with CHG (chlorahexidine gluconate) Soap before surgery.  CHG is an antiseptic cleaner which kills germs and bonds with the skin to continue killing germs even after washing.    Oral Hygiene is also important to reduce your risk of infection.  Remember - BRUSH YOUR TEETH THE MORNING OF SURGERY WITH YOUR REGULAR TOOTHPASTE  Please do not use if you have an allergy to CHG or antibacterial soaps. If your skin becomes reddened/irritated stop using the CHG.  Do not shave (including legs and underarms) for at least 48 hours prior to first CHG shower. It is OK to shave your face.  Please follow these instructions carefully.   1. Shower the NIGHT BEFORE SURGERY and the MORNING OF SURGERY with CHG Soap.   2. If you chose to wash your hair, wash your hair first as usual with your normal shampoo.  3. After you shampoo, rinse your hair and body thoroughly to remove the shampoo.  4. Use CHG as you would any other liquid soap. You can apply CHG directly to the skin and wash gently with a scrungie or a clean washcloth.   5. Apply the CHG Soap to your body ONLY FROM THE NECK DOWN.  Do not use on open wounds or open sores. Avoid contact with your eyes, ears, mouth and genitals (private parts). Wash Face and genitals (private parts)  with your normal soap.   6. Wash thoroughly, paying special attention to the area where your surgery will be performed.  7. Thoroughly rinse your body with warm water from the neck down.  8. DO NOT shower/wash with your normal soap after using and rinsing off the CHG Soap.  9. Pat yourself dry with a CLEAN TOWEL.  10. Wear CLEAN PAJAMAS to bed the night before surgery  11. Place CLEAN SHEETS on your bed the night of your first shower and DO NOT SLEEP WITH  PETS.   Day of Surgery: Wear Clean/Comfortable clothing the morning of surgery Do not apply any deodorants/lotions.   Remember to brush your teeth WITH YOUR REGULAR TOOTHPASTE.   Please read over the following fact sheets that you were given.

## 2020-03-23 ENCOUNTER — Encounter (HOSPITAL_COMMUNITY): Payer: Self-pay

## 2020-03-23 ENCOUNTER — Other Ambulatory Visit: Payer: Self-pay

## 2020-03-23 ENCOUNTER — Encounter (HOSPITAL_COMMUNITY)
Admission: RE | Admit: 2020-03-23 | Discharge: 2020-03-23 | Disposition: A | Discharge status: Home / Self Care | Payer: Medicare (Managed Care) | Source: Ambulatory Visit | Attending: General Surgery | Admitting: General Surgery

## 2020-03-23 DIAGNOSIS — Z01818 Encounter for other preprocedural examination: Secondary | ICD-10-CM | POA: Diagnosis not present

## 2020-03-23 DIAGNOSIS — I10 Essential (primary) hypertension: Secondary | ICD-10-CM | POA: Diagnosis not present

## 2020-03-23 LAB — CBC
HCT: 42 % (ref 39.0–52.0)
Hemoglobin: 13.6 g/dL (ref 13.0–17.0)
MCH: 27.3 pg (ref 26.0–34.0)
MCHC: 32.4 g/dL (ref 30.0–36.0)
MCV: 84.2 fL (ref 80.0–100.0)
Platelets: 224 10*3/uL (ref 150–400)
RBC: 4.99 MIL/uL (ref 4.22–5.81)
RDW: 13.8 % (ref 11.5–15.5)
WBC: 3.9 10*3/uL — ABNORMAL LOW (ref 4.0–10.5)
nRBC: 0 % (ref 0.0–0.2)

## 2020-03-23 LAB — BASIC METABOLIC PANEL
Anion gap: 10 (ref 5–15)
BUN: 10 mg/dL (ref 8–23)
CO2: 26 mmol/L (ref 22–32)
Calcium: 9.1 mg/dL (ref 8.9–10.3)
Chloride: 106 mmol/L (ref 98–111)
Creatinine, Ser: 0.92 mg/dL (ref 0.61–1.24)
GFR calc Af Amer: >60 mL/min (ref >60)
GFR calc non Af Amer: >60 mL/min (ref >60)
Glucose, Bld: 140 mg/dL — ABNORMAL HIGH (ref 70–99)
Potassium: 4.1 mmol/L (ref 3.5–5.1)
Sodium: 142 mmol/L (ref 135–145)

## 2020-03-23 LAB — HEMOGLOBIN A1C
Hgb A1c MFr Bld: 5.9 % — ABNORMAL HIGH (ref 4.8–5.6)
Mean Plasma Glucose: 122.63 mg/dL

## 2020-03-23 LAB — GLUCOSE, CAPILLARY: Glucose-Capillary: 148 mg/dL — ABNORMAL HIGH (ref 70–99)

## 2020-03-23 NOTE — Progress Notes (Signed)
PCP - [facility] clinic Macky Lower Cardiologist - denies   Chest x-ray - n/a EKG - 03/23/20 Stress Test - denies ECHO - denies Cardiac Cath - denies   Fasting Blood Sugar - 140-160s Checks Blood Sugar __1-2___ times a day  Blood Thinner Instructions: n/a Aspirin Instructions:n/a  ERAS Protcol - clears until 1130 PRE-SURGERY Ensure or G2- G2 given  COVID TEST- 03/25/20   Anesthesia review: NO  Patient denies shortness of breath, fever, cough and chest pain at PAT appointment   All instructions explained to the patient, with a verbal understanding of the material. Patient agrees to go over the instructions while at home for a better understanding. Patient also instructed to self quarantine after being tested for COVID-19. The opportunity to ask questions was provided.

## 2020-03-25 ENCOUNTER — Other Ambulatory Visit (HOSPITAL_COMMUNITY)
Admission: RE | Admit: 2020-03-25 | Discharge: 2020-03-25 | Disposition: A | Discharge status: Home / Self Care | Payer: Medicare (Managed Care) | Source: Ambulatory Visit | Attending: General Surgery | Admitting: General Surgery

## 2020-03-25 DIAGNOSIS — Z01812 Encounter for preprocedural laboratory examination: Secondary | ICD-10-CM | POA: Insufficient documentation

## 2020-03-25 DIAGNOSIS — Z20822 Contact with and (suspected) exposure to covid-19: Secondary | ICD-10-CM | POA: Diagnosis not present

## 2020-03-25 LAB — SARS CORONAVIRUS 2 (TAT 6-24 HRS): SARS Coronavirus 2: NEGATIVE

## 2020-03-29 ENCOUNTER — Other Ambulatory Visit: Payer: Self-pay

## 2020-03-29 ENCOUNTER — Ambulatory Visit (HOSPITAL_COMMUNITY): Payer: Medicare (Managed Care) | Admitting: Certified Registered Nurse Anesthetist

## 2020-03-29 ENCOUNTER — Ambulatory Visit (HOSPITAL_COMMUNITY)
Admission: RE | Admit: 2020-03-29 | Discharge: 2020-03-29 | Disposition: A | Discharge status: Home / Self Care | Payer: Medicare (Managed Care) | Attending: General Surgery | Admitting: General Surgery

## 2020-03-29 ENCOUNTER — Ambulatory Visit (HOSPITAL_COMMUNITY): Payer: Medicare (Managed Care) | Admitting: Vascular Surgery

## 2020-03-29 ENCOUNTER — Encounter (HOSPITAL_COMMUNITY)
Admission: RE | Disposition: A | Discharge status: Home / Self Care | Payer: Self-pay | Source: Home / Self Care | Attending: General Surgery

## 2020-03-29 ENCOUNTER — Encounter (HOSPITAL_COMMUNITY): Payer: Self-pay | Admitting: General Surgery

## 2020-03-29 DIAGNOSIS — Z79899 Other long term (current) drug therapy: Secondary | ICD-10-CM | POA: Insufficient documentation

## 2020-03-29 DIAGNOSIS — E119 Type 2 diabetes mellitus without complications: Secondary | ICD-10-CM | POA: Diagnosis not present

## 2020-03-29 DIAGNOSIS — I10 Essential (primary) hypertension: Secondary | ICD-10-CM | POA: Diagnosis not present

## 2020-03-29 DIAGNOSIS — Z7984 Long term (current) use of oral hypoglycemic drugs: Secondary | ICD-10-CM | POA: Diagnosis not present

## 2020-03-29 DIAGNOSIS — K4090 Unilateral inguinal hernia, without obstruction or gangrene, not specified as recurrent: Secondary | ICD-10-CM | POA: Diagnosis not present

## 2020-03-29 HISTORY — PX: XI ROBOTIC ASSISTED INGUINAL HERNIA REPAIR WITH MESH: SHX6706

## 2020-03-29 LAB — GLUCOSE, CAPILLARY
Glucose-Capillary: 122 mg/dL — ABNORMAL HIGH (ref 70–99)
Glucose-Capillary: 128 mg/dL — ABNORMAL HIGH (ref 70–99)

## 2020-03-29 SURGERY — Surgical Case
Anesthesia: *Unknown

## 2020-03-29 SURGERY — REPAIR, HERNIA, INGUINAL, ROBOT-ASSISTED, LAPAROSCOPIC, USING MESH
Anesthesia: General | Site: Abdomen | Laterality: Right

## 2020-03-29 MED ORDER — LIDOCAINE 2% (20 MG/ML) 5 ML SYRINGE
INTRAMUSCULAR | Status: DC | PRN
  Administered 2020-03-29: 100 mg via INTRAVENOUS

## 2020-03-29 MED ORDER — VANCOMYCIN HCL IN DEXTROSE 1-5 GM/200ML-% IV SOLN
1000.0000 mg | INTRAVENOUS | Status: AC
  Administered 2020-03-29: 1000 mg via INTRAVENOUS
  Filled 2020-03-29: qty 200

## 2020-03-29 MED ORDER — ENSURE PRE-SURGERY PO LIQD: 296.0000 mL | Freq: Once | ORAL | Status: DC

## 2020-03-29 MED ORDER — BUPIVACAINE LIPOSOME 1.3 % IJ SUSP
20.0000 mL | Freq: Once | INTRAMUSCULAR | Status: DC
  Filled 2020-03-29: qty 20

## 2020-03-29 MED ORDER — CHLORHEXIDINE GLUCONATE 0.12 % MT SOLN
15.0000 mL | Freq: Once | OROMUCOSAL | Status: AC
  Administered 2020-03-29: 15 mL via OROMUCOSAL
  Filled 2020-03-29: qty 15

## 2020-03-29 MED ORDER — ONDANSETRON HCL 4 MG/2ML IJ SOLN
INTRAMUSCULAR | Status: DC | PRN
  Administered 2020-03-29: 4 mg via INTRAVENOUS

## 2020-03-29 MED ORDER — PROPOFOL 10 MG/ML IV BOLUS
INTRAVENOUS | Status: DC | PRN
  Administered 2020-03-29 (×2): 50 mg via INTRAVENOUS
  Administered 2020-03-29: 100 mg via INTRAVENOUS

## 2020-03-29 MED ORDER — ORAL CARE MOUTH RINSE: 15.0000 mL | Freq: Once | OROMUCOSAL | Status: AC

## 2020-03-29 MED ORDER — PHENYLEPHRINE 40 MCG/ML (10ML) SYRINGE FOR IV PUSH (FOR BLOOD PRESSURE SUPPORT)
PREFILLED_SYRINGE | INTRAVENOUS | Status: DC | PRN
  Administered 2020-03-29: 80 ug via INTRAVENOUS

## 2020-03-29 MED ORDER — CHLORHEXIDINE GLUCONATE CLOTH 2 % EX PADS: 6.0000 | MEDICATED_PAD | Freq: Once | CUTANEOUS | Status: DC

## 2020-03-29 MED ORDER — DEXAMETHASONE SODIUM PHOSPHATE 10 MG/ML IJ SOLN
INTRAMUSCULAR | Status: AC
  Filled 2020-03-29: qty 1

## 2020-03-29 MED ORDER — PHENYLEPHRINE HCL-NACL 10-0.9 MG/250ML-% IV SOLN
INTRAVENOUS | Status: DC | PRN
  Administered 2020-03-29: 25 ug/min via INTRAVENOUS

## 2020-03-29 MED ORDER — ROCURONIUM BROMIDE 10 MG/ML (PF) SYRINGE
PREFILLED_SYRINGE | INTRAVENOUS | Status: DC | PRN
  Administered 2020-03-29: 80 mg via INTRAVENOUS
  Administered 2020-03-29: 20 mg via INTRAVENOUS

## 2020-03-29 MED ORDER — PHENYLEPHRINE HCL-NACL 10-0.9 MG/250ML-% IV SOLN
INTRAVENOUS | Status: AC
  Filled 2020-03-29: qty 250

## 2020-03-29 MED ORDER — DEXAMETHASONE SODIUM PHOSPHATE 10 MG/ML IJ SOLN
INTRAMUSCULAR | Status: DC | PRN
  Administered 2020-03-29: 10 mg via INTRAVENOUS

## 2020-03-29 MED ORDER — BUPIVACAINE HCL (PF) 0.25 % IJ SOLN
INTRAMUSCULAR | Status: AC
  Filled 2020-03-29: qty 30

## 2020-03-29 MED ORDER — STERILE WATER FOR IRRIGATION IR SOLN
Status: DC | PRN
  Administered 2020-03-29: 1

## 2020-03-29 MED ORDER — ONDANSETRON HCL 4 MG/2ML IJ SOLN
INTRAMUSCULAR | Status: AC
  Filled 2020-03-29: qty 2

## 2020-03-29 MED ORDER — EPHEDRINE SULFATE-NACL 50-0.9 MG/10ML-% IV SOSY
PREFILLED_SYRINGE | INTRAVENOUS | Status: DC | PRN
  Administered 2020-03-29: 10 mg via INTRAVENOUS

## 2020-03-29 MED ORDER — LACTATED RINGERS IV SOLN: INTRAVENOUS | Status: DC

## 2020-03-29 MED ORDER — FENTANYL CITRATE (PF) 250 MCG/5ML IJ SOLN
INTRAMUSCULAR | Status: DC | PRN
Start: 2020-03-29 — End: 2020-03-29
  Administered 2020-03-29 (×3): 50 ug via INTRAVENOUS
  Administered 2020-03-29: 100 ug via INTRAVENOUS

## 2020-03-29 MED ORDER — BUPIVACAINE LIPOSOME 1.3 % IJ SUSP
INTRAMUSCULAR | Status: DC | PRN
  Administered 2020-03-29: 20 mL

## 2020-03-29 MED ORDER — ROCURONIUM BROMIDE 10 MG/ML (PF) SYRINGE
PREFILLED_SYRINGE | INTRAVENOUS | Status: AC
  Filled 2020-03-29: qty 10

## 2020-03-29 MED ORDER — FENTANYL CITRATE (PF) 250 MCG/5ML IJ SOLN
INTRAMUSCULAR | Status: AC
  Filled 2020-03-29: qty 5

## 2020-03-29 MED ORDER — BUPIVACAINE HCL 0.25 % IJ SOLN
INTRAMUSCULAR | Status: DC | PRN
  Administered 2020-03-29: 8 mL

## 2020-03-29 MED ORDER — ACETAMINOPHEN 500 MG PO TABS
1000.0000 mg | ORAL_TABLET | ORAL | Status: AC
  Administered 2020-03-29: 1000 mg via ORAL
  Filled 2020-03-29: qty 2

## 2020-03-29 MED ORDER — 0.9 % SODIUM CHLORIDE (POUR BTL) OPTIME
TOPICAL | Status: DC | PRN
  Administered 2020-03-29: 1000 mL

## 2020-03-29 MED ORDER — TRAMADOL HCL 50 MG PO TABS: 50.0000 mg | ORAL_TABLET | Freq: Four times a day (QID) | ORAL | 0 refills | Status: AC | PRN

## 2020-03-29 MED ORDER — SUGAMMADEX SODIUM 200 MG/2ML IV SOLN
INTRAVENOUS | Status: DC | PRN
  Administered 2020-03-29: 200 mg via INTRAVENOUS

## 2020-03-29 SURGICAL SUPPLY — 63 items
ADH SKN CLS APL DERMABOND .7 (GAUZE/BANDAGES/DRESSINGS) ×1
APL PRP STRL LF DISP 70% ISPRP (MISCELLANEOUS) ×1
CANNULA REDUC XI 12-8 STAPL (CANNULA) ×2
CANNULA REDUC XI 12-8MM STAPL (CANNULA) ×1
CANNULA REDUCER 12-8 DVNC XI (CANNULA) IMPLANT
CHLORAPREP W/TINT 26 (MISCELLANEOUS) ×3 IMPLANT
COVER MAYO STAND STRL (DRAPES) ×3 IMPLANT
COVER SURGICAL LIGHT HANDLE (MISCELLANEOUS) ×3 IMPLANT
COVER TIP SHEARS 8 DVNC (MISCELLANEOUS) ×1 IMPLANT
COVER TIP SHEARS 8MM DA VINCI (MISCELLANEOUS) ×3
COVER WAND RF STERILE (DRAPES) IMPLANT
DECANTER SPIKE VIAL GLASS SM (MISCELLANEOUS) ×1 IMPLANT
DEFOGGER SCOPE WARMER CLEARIFY (MISCELLANEOUS) ×3 IMPLANT
DERMABOND ADVANCED (GAUZE/BANDAGES/DRESSINGS) ×2
DERMABOND ADVANCED .7 DNX12 (GAUZE/BANDAGES/DRESSINGS) ×1 IMPLANT
DEVICE TROCAR PUNCTURE CLOSURE (ENDOMECHANICALS) ×3 IMPLANT
DRAPE ARM DVNC X/XI (DISPOSABLE) ×4 IMPLANT
DRAPE COLUMN DVNC XI (DISPOSABLE) ×1 IMPLANT
DRAPE CV SPLIT W-CLR ANES SCRN (DRAPES) ×3 IMPLANT
DRAPE DA VINCI XI ARM (DISPOSABLE) ×12
DRAPE DA VINCI XI COLUMN (DISPOSABLE) ×3
DRAPE ORTHO SPLIT 77X108 STRL (DRAPES) ×3
DRAPE SURG ORHT 6 SPLT 77X108 (DRAPES) ×1 IMPLANT
ELECT REM PT RETURN 9FT ADLT (ELECTROSURGICAL) ×3
ELECTRODE REM PT RTRN 9FT ADLT (ELECTROSURGICAL) ×1 IMPLANT
GLOVE BIO SURGEON STRL SZ7.5 (GLOVE) ×6 IMPLANT
GOWN STRL REUS W/ TWL LRG LVL3 (GOWN DISPOSABLE) ×2 IMPLANT
GOWN STRL REUS W/ TWL XL LVL3 (GOWN DISPOSABLE) ×2 IMPLANT
GOWN STRL REUS W/TWL 2XL LVL3 (GOWN DISPOSABLE) ×3 IMPLANT
GOWN STRL REUS W/TWL LRG LVL3 (GOWN DISPOSABLE) ×3
GOWN STRL REUS W/TWL XL LVL3 (GOWN DISPOSABLE) ×3
KIT BASIN OR (CUSTOM PROCEDURE TRAY) ×3 IMPLANT
KIT TURNOVER KIT B (KITS) ×3 IMPLANT
MARKER SKIN DUAL TIP RULER LAB (MISCELLANEOUS) ×3 IMPLANT
MESH PROGRIP LAP SELF FIXATING (Mesh General) ×3 IMPLANT
MESH PROGRIP LAP SLF FIX 16X12 (Mesh General) IMPLANT
NDL INSUFFLATION 14GA 120MM (NEEDLE) ×1 IMPLANT
NEEDLE HYPO 22GX1.5 SAFETY (NEEDLE) ×3 IMPLANT
NEEDLE INSUFFLATION 14GA 120MM (NEEDLE) ×3 IMPLANT
OBTURATOR OPTICAL STANDARD 8MM (TROCAR)
OBTURATOR OPTICAL STND 8 DVNC (TROCAR)
OBTURATOR OPTICALSTD 8 DVNC (TROCAR) IMPLANT
PAD ARMBOARD 7.5X6 YLW CONV (MISCELLANEOUS) ×6 IMPLANT
PENCIL SMOKE EVACUATOR (MISCELLANEOUS) IMPLANT
SCISSORS LAP 5X35 DISP (ENDOMECHANICALS) IMPLANT
SEAL CANN UNIV 5-8 DVNC XI (MISCELLANEOUS) ×2 IMPLANT
SEAL XI 5MM-8MM UNIVERSAL (MISCELLANEOUS) ×6
SET IRRIG TUBING LAPAROSCOPIC (IRRIGATION / IRRIGATOR) IMPLANT
SET TUBE SMOKE EVAC HIGH FLOW (TUBING) ×3 IMPLANT
STAPLER CANNULA SEAL DVNC XI (STAPLE) IMPLANT
STAPLER CANNULA SEAL XI (STAPLE) ×3
STOPCOCK 4 WAY LG BORE MALE ST (IV SETS) ×3 IMPLANT
SUT MNCRL AB 4-0 PS2 18 (SUTURE) ×3 IMPLANT
SUT VIC AB 2-0 SH 27 (SUTURE) ×3
SUT VIC AB 2-0 SH 27X BRD (SUTURE) ×1 IMPLANT
SUT VLOC 180 2-0 9IN GS21 (SUTURE) ×3 IMPLANT
SYR 30ML SLIP (SYRINGE) ×3 IMPLANT
SYR TOOMEY 50ML (SYRINGE) ×3 IMPLANT
TOWEL GREEN STERILE FF (TOWEL DISPOSABLE) ×3 IMPLANT
TRAY FOLEY MTR SLVR 14FR STAT (SET/KITS/TRAYS/PACK) IMPLANT
TRAY FOLEY MTR SLVR 16FR STAT (SET/KITS/TRAYS/PACK) ×3 IMPLANT
TRAY LAPAROSCOPIC MC (CUSTOM PROCEDURE TRAY) ×3 IMPLANT
TROCAR XCEL NON-BLD 5MMX100MML (ENDOMECHANICALS) IMPLANT

## 2020-03-29 NOTE — Anesthesia Procedure Notes (Addendum)
Procedure Name: Intubation Date/Time: 03/29/2020 12:54 PM Performed by: Kyung Rudd, CRNA Pre-anesthesia Checklist: Patient identified, Emergency Drugs available, Suction available and Patient being monitored Patient Re-evaluated:Patient Re-evaluated prior to induction Oxygen Delivery Method: Circle System Utilized Preoxygenation: Pre-oxygenation with 100% oxygen Induction Type: IV induction Ventilation: Mask ventilation without difficulty Laryngoscope Size: Glidescope and 4 Tube type: Oral Number of attempts: 3 Airway Equipment and Method: Stylet and Oral airway Placement Confirmation: ETT inserted through vocal cords under direct vision,  positive ETCO2 and breath sounds checked- equal and bilateral Secured at: 22 cm Tube secured with: Tape Dental Injury: Teeth and Oropharynx as per pre-operative assessment  Comments: DL x1 MAC 4 by SRNA, unable to visualize vocal cords; second DL with Miller 3 by Dr. Tobias Alexander, passed into stomach. ETT removed and patient ventilated, sats remained 100%. 3rd DL with glidescope 4, grade 1 view on screen, AOI by SRNA.

## 2020-03-29 NOTE — Interval H&P Note (Signed)
History and Physical Interval Note:  03/29/2020 12:03 PM  Gabriel Harrison  has presented today for surgery, with the diagnosis of right inguinal hernia.  The various methods of treatment have been discussed with the patient and family. After consideration of risks, benefits and other options for treatment, the patient has consented to  Procedure(s): XI ROBOTIC  INGUINAL HERNIA REPAIR WITH MESH (Right) as a surgical intervention.  The patient's history has been reviewed, patient examined, no change in status, stable for surgery.  I have reviewed the patient's chart and labs.  Questions were answered to the patient's satisfaction.     Axel Filler

## 2020-03-29 NOTE — Anesthesia Preprocedure Evaluation (Addendum)
Anesthesia Evaluation  Patient identified by MRN, date of birth, ID band Patient awake    Reviewed: Allergy & Precautions, NPO status , Patient's Chart, lab work & pertinent test results  History of Anesthesia Complications Negative for: history of anesthetic complications  Airway Mallampati: III  TM Distance: >3 FB Neck ROM: Limited    Dental  (+) Poor Dentition, Missing, Dental Advisory Given   Pulmonary neg pulmonary ROS,    Pulmonary exam normal        Cardiovascular hypertension, Pt. on medications Normal cardiovascular exam     Neuro/Psych negative neurological ROS     GI/Hepatic Neg liver ROS,   Endo/Other  diabetes  Renal/GU negative Renal ROS     Musculoskeletal negative musculoskeletal ROS (+)   Abdominal   Peds  Hematology negative hematology ROS (+)   Anesthesia Other Findings   Reproductive/Obstetrics                            Anesthesia Physical Anesthesia Plan  ASA: III  Anesthesia Plan: General   Post-op Pain Management:    Induction: Intravenous  PONV Risk Score and Plan: 4 or greater and Ondansetron, Dexamethasone and Treatment may vary due to age or medical condition  Airway Management Planned: Oral ETT  Additional Equipment: None  Intra-op Plan:   Post-operative Plan: Extubation in OR  Informed Consent: I have reviewed the patients History and Physical, chart, labs and discussed the procedure including the risks, benefits and alternatives for the proposed anesthesia with the patient or authorized representative who has indicated his/her understanding and acceptance.     Dental advisory given  Plan Discussed with: Anesthesiologist and CRNA  Anesthesia Plan Comments:        Anesthesia Quick Evaluation

## 2020-03-29 NOTE — Transfer of Care (Signed)
Immediate Anesthesia Transfer of Care Note  Patient: Gabriel Harrison  Procedure(s) Performed: XI ROBOTIC  INGUINAL HERNIA REPAIR WITH MESH (Right Abdomen)  Patient Location: PACU  Anesthesia Type:General  Level of Consciousness: awake and alert   Airway & Oxygen Therapy: Patient Spontanous Breathing  Post-op Assessment: Report given to RN, Post -op Vital signs reviewed and stable and Patient moving all extremities X 4  Post vital signs: Reviewed and stable  Last Vitals:  Vitals Value Taken Time  BP 165/87 03/29/20 1429  Temp    Pulse 71 03/29/20 1432  Resp 17 03/29/20 1432  SpO2 100 % 03/29/20 1432  Vitals shown include unvalidated device data.  Last Pain:  Vitals:   03/29/20 1202  TempSrc:   PainSc: 3       Patients Stated Pain Goal: 3 (03/29/20 1202)  Complications: No complications documented.

## 2020-03-29 NOTE — Anesthesia Postprocedure Evaluation (Signed)
Anesthesia Post Note  Patient: Gabriel Harrison  Procedure(s) Performed: XI ROBOTIC  INGUINAL HERNIA REPAIR WITH MESH (Right Abdomen)     Patient location during evaluation: PACU Anesthesia Type: General Level of consciousness: awake and alert Pain management: pain level controlled Vital Signs Assessment: post-procedure vital signs reviewed and stable Respiratory status: spontaneous breathing, nonlabored ventilation and respiratory function stable Cardiovascular status: blood pressure returned to baseline and stable Postop Assessment: no apparent nausea or vomiting Anesthetic complications: no   No complications documented.  Last Vitals:  Vitals:   03/29/20 1530 03/29/20 1545  BP: 140/68 (!) 147/75  Pulse: 74 75  Resp: 14 16  Temp:  36.5 C  SpO2: 98% 99%    Last Pain:  Vitals:   03/29/20 1545  TempSrc:   PainSc: 0-No pain                 [name],W. EDMOND

## 2020-03-29 NOTE — Discharge Instructions (Signed)
[facility] _______[facility] °INGUINAL HERNIA REPAIR: POST OP INSTRUCTIONS ° °Always review your discharge instruction sheet given to you by the facility where your surgery was performed. °IF YOU HAVE DISABILITY OR FAMILY LEAVE FORMS, YOU MUST BRING THEM TO THE OFFICE FOR PROCESSING.   °DO NOT GIVE THEM TO YOUR DOCTOR. ° °1. A  prescription for pain medication may be given to you upon discharge.  Take your pain medication as prescribed, if needed.  If narcotic pain medicine is not needed, then you may take acetaminophen (Tylenol) or ibuprofen (Advil) as needed. °2. Take your usually prescribed medications unless otherwise directed. °If you need a refill on your pain medication, please contact your pharmacy.  They will contact our office to request authorization. Prescriptions will not be filled after 5 pm or on week-ends. °3. You should follow a light diet the first 24 hours after arrival home, such as soup and crackers, etc.  Be sure to include lots of fluids daily.  Resume your normal diet the day after surgery. °4.Most patients will experience some swelling and bruising around the umbilicus or in the groin and scrotum.  Ice packs and reclining will help.  Swelling and bruising can take several days to resolve.  °6. It is common to experience some constipation if taking pain medication after surgery.  Increasing fluid intake and taking a stool softener (such as Colace) will usually help or prevent this problem from occurring.  A mild laxative (Milk of Magnesia or Miralax) should be taken according to package directions if there are no bowel movements after 48 hours. °7. Unless discharge instructions indicate otherwise, you may remove your bandages 24-48 hours after surgery, and you may shower at that time.  You may have steri-strips (small skin tapes) in place directly over the incision.  These strips should be left on the skin for 7-10 days.  If your surgeon used skin glue on the incision, you may  shower in 24 hours.  The glue will flake off over the next 2-3 weeks.  Any sutures or staples will be removed at the office during your follow-up visit. °8. ACTIVITIES:  You may resume regular (light) daily activities beginning the next day--such as daily self-care, walking, climbing stairs--gradually increasing activities as tolerated.  You may have sexual intercourse when it is comfortable.  Refrain from any heavy lifting or straining until approved by your doctor. ° °a.You may drive when you are no longer taking prescription pain medication, you can comfortably wear a seatbelt, and you can safely maneuver your car and apply brakes. °b.RETURN TO WORK:   °_____________________________________________ ° °9.You should see your doctor in the office for a follow-up appointment approximately 2-3 weeks after your surgery.  Make sure that you call for this appointment within a day or two after you arrive home to insure a convenient appointment time. °10.OTHER INSTRUCTIONS: _________________________ °   _____________________________________ ° °WHEN TO CALL YOUR DOCTOR: °1. Fever over 101.0 °2. Inability to urinate °3. Nausea and/or vomiting °4. Extreme swelling or bruising °5. Continued bleeding from incision. °6. Increased pain, redness, or drainage from the incision ° °The clinic staff is available to answer your questions during regular business hours.  Please don’t hesitate to call and ask to speak to one of the nurses for clinical concerns.  If you have a medical emergency, go to the nearest emergency room or call 911.  A surgeon from [facility] is always on call at the hospital ° ° °[address], [address], [place], [place]  [address] ? ° [address] [address], [place], [place]   [address] °[phone] ? [phone] ? FAX [phone] °Web site: [email or web address] ° °

## 2020-03-29 NOTE — Op Note (Signed)
03/29/2020  2:09 PM  PATIENT:  Gabriel Harrison  79 y.o. male  PRE-OPERATIVE DIAGNOSIS:  right inguinal hernia  POST-OPERATIVE DIAGNOSIS: large, right indirect inguinal hernia  PROCEDURE:  Procedure(s): XI ROBOTIC  INGUINAL HERNIA REPAIR WITH MESH (Right)  SURGEON:  Surgeon(s) and Role:    Axel Filler, MD - Primary  ANESTHESIA:   local, regional and general  EBL:  10 mL   BLOOD ADMINISTERED:none  DRAINS: none   LOCAL MEDICATIONS USED:  BUPIVICAINE + Exparil  SPECIMEN:  No Specimen  DISPOSITION OF SPECIMEN:  N/A  COUNTS:  YES  TOURNIQUET:  * No tourniquets in log *  DICTATION: .Dragon Dictation Details of the procedure:   The patient was taken back to the operating room. The patient was placed in supine position with bilateral SCDs in place.  A Foley catheter was placed.  The patient was prepped and draped in the usual sterile fashion.  After appropriate anitbiotics were confirmed, a time-out was confirmed and all facts were verified.  At this time a Veress needle technique was used to insufflate the abdomen approximately 10 cm from the umbilicus and the left paramedian line. At this time a 12 mm robotic trocar was placed into the abdomen. The camera was placed there was no injury to any intra-abdominal organs. A 68mm umbilical port was placed just superior to the umbilicus. An 8 mm port was placed approximately 10 cm lateral to the umbilicus on the right paramedian side.  Robot was positioned over the patient and the ports were docked in the usual fashion.  At this time the right-sided peritoneum was taken down from the medial umbilical ligament laterally. The pre-peritoneal space was entered. Dissection was taken down to Cooper's ligament. At this time it was apparent there was a large indirect hernia.   At this time I proceeded to dissect Cooper's ligament and the medial to lateral direction. A proceeded laterally to dissect the spermatic cord. The spermatic cord was  circumferentially dissected away from the surrounding musculature and tissue and hernia.  The hernia was very large and appeared to contain the appendix. The vas deferens was identified and protected all portions of the case. The peritoneum was dissected back. At this time I proceeded to create a pocket laterally for the mesh. Once the pocket was created the peritoneum was stripped back to approximately the base of the cord. At this time the piece of 16x12cm ProGrip mesh was in placed into the dissected area. This covered both the direct and indirect spaces appropriately. This also covered the femoral space.  The mesh lay flat from medial to lateral. At this time a 2 V- lock stitch was used to close the peritoneum in a standard running fashion.  At this time the robot was undocked. The left port site was reapproximated using a 0 Vicryl via an Endo Close device 1. All ports were removed. The skin was reapproximated and all port sites using 4-0 Monocryl subcuticular fashion.  The patient the procedure well was taken to the recovery     PLAN OF CARE: Discharge to home after PACU  PATIENT DISPOSITION:  PACU - hemodynamically stable.   Delay start of Pharmacological VTE agent (>24hrs) due to surgical blood loss or risk of bleeding: not applicable

## 2020-03-30 ENCOUNTER — Encounter (HOSPITAL_COMMUNITY): Payer: Self-pay | Admitting: General Surgery

## 2020-12-25 ENCOUNTER — Encounter: Payer: Self-pay | Admitting: Emergency Medicine

## 2020-12-25 ENCOUNTER — Ambulatory Visit
Admission: EM | Admit: 2020-12-25 | Discharge: 2020-12-25 | Disposition: A | Discharge status: Home / Self Care | Payer: Medicare (Managed Care) | Attending: Emergency Medicine | Admitting: Emergency Medicine

## 2020-12-25 ENCOUNTER — Other Ambulatory Visit: Payer: Self-pay

## 2020-12-25 ENCOUNTER — Ambulatory Visit (INDEPENDENT_AMBULATORY_CARE_PROVIDER_SITE_OTHER): Payer: Medicare (Managed Care)

## 2020-12-25 DIAGNOSIS — M7989 Other specified soft tissue disorders: Secondary | ICD-10-CM

## 2020-12-25 DIAGNOSIS — M79642 Pain in left hand: Secondary | ICD-10-CM

## 2020-12-25 MED ORDER — PREDNISONE 10 MG PO TABS: ORAL_TABLET | ORAL | 0 refills | Status: DC

## 2020-12-25 NOTE — ED Provider Notes (Signed)
[facility]    CSN: [id number] Arrival date & time: 12/25/20  1458      History   Chief Complaint Chief Complaint  Patient presents with   Hand Pain    HPI Gabriel Harrison is a 80 y.o. male history of hypertension, DM type II, presenting today for evaluation of left hand pain and swelling.  Reports symptoms have been going on for over 1 week.  Have flared up and down slightly with use of ice.  Also using ibuprofen without relief.  Denies history of similar or prior injury.  Denies any new injury or trauma.  Pain radiates from wrist into hand.  Denies numbness or tingling.  Denies history of gout.  Denies history of other arthralgias/swellings.  HPI  Past Medical History:  Diagnosis Date   Arthritis    Diabetes mellitus without complication (HCC)    Hypertension     Patient Active Problem List   Diagnosis Date Noted   Inguinal hernia 02/28/2020    Past Surgical History:  Procedure Laterality Date   COLONOSCOPY     XI ROBOTIC ASSISTED INGUINAL HERNIA REPAIR WITH MESH Right 03/29/2020   Procedure: XI ROBOTIC  INGUINAL HERNIA REPAIR WITH MESH;  Surgeon: Axel Filler, MD;  Location: [facility] OR;  Service: General;  Laterality: Right;       Home Medications    Prior to Admission medications   Medication Sig Start Date End Date Taking? Authorizing Provider  predniSONE (DELTASONE) 10 MG tablet Begin with 6 tabs on day 1, 5 tab on day 2, 4 tab on day 3, 3 tab on day 4, 2 tab on day 5, 1 tab on day 6-take with food 12/25/20  Yes Norinne Jeane C, PA-C  acetaminophen (TYLENOL) 325 MG tablet Take 325-650 mg by mouth every 6 (six) hours as needed for mild pain.     [provider]  amLODipine (NORVASC) 5 MG tablet Take 5 mg by mouth daily.  12/12/14   [provider]  benazepril (LOTENSIN) 40 MG tablet Take 40 mg by mouth 2 (two) times daily.  08/16/14   [provider]  glipiZIDE (GLUCOTROL XL) 5 MG 24 hr tablet Take 5 mg by mouth daily. 01/04/20    [provider]  metFORMIN (GLUCOPHAGE) 1000 MG tablet Take 1,000-1,500 mg by mouth See admin instructions. Take 1.5 tablets (1500 mg) by mouth in the morning & take 1 tablet (1000 mg) by mouth in the evening. 05/19/14   [provider]  simvastatin (ZOCOR) 20 MG tablet Take 20 mg by mouth daily.  09/13/14   [provider]  tamsulosin (FLOMAX) 0.4 MG CAPS capsule Take 0.4 mg by mouth daily.  02/17/14   [provider]  traMADol (ULTRAM) 50 MG tablet Take 1/2 to 1 tablet TID Patient taking differently: Take 25-50 mg by mouth 3 (three) times daily as needed (pain.).  03/02/16   Ofilia Neas, PA-C  traMADol (ULTRAM) 50 MG tablet Take 1 tablet (50 mg total) by mouth every 6 (six) hours as needed. 03/29/20 03/29/21  Axel Filler, MD    Family History History reviewed. No pertinent family history.  Social History Social History   Tobacco Use   Smoking status: Never   Smokeless tobacco: Never  Substance Use Topics   Alcohol use: No   Drug use: No     Allergies   Amoxicillin-pot clavulanate   Review of Systems Review of Systems  Constitutional:  Negative for fatigue and fever.  Eyes:  Negative  for redness, itching and visual disturbance.  Respiratory:  Negative for shortness of breath.   Cardiovascular:  Negative for chest pain and leg swelling.  Gastrointestinal:  Negative for nausea and vomiting.  Musculoskeletal:  Positive for arthralgias and joint swelling. Negative for myalgias.  Skin:  Negative for color change, rash and wound.  Neurological:  Negative for dizziness, syncope, weakness, light-headedness and headaches.    Physical Exam Triage Vital Signs ED Triage Vitals [12/25/20 1632]  Enc Vitals Group     BP (!) 143/67     Pulse Rate 79     Resp 18     Temp 98.1 F (36.7 C)     Temp Source Oral     SpO2 99 %     Weight      Height      Head Circumference      Peak Flow      Pain Score 5     Pain Loc      Pain Edu?       Excl. in GC?    No data found.  Updated Vital Signs BP (!) 143/67 (BP Location: Right Arm)   Pulse 79   Temp 98.1 F (36.7 C) (Oral)   Resp 18   SpO2 99%   Visual Acuity Right Eye Distance:   Left Eye Distance:   Bilateral Distance:    Right Eye Near:   Left Eye Near:    Bilateral Near:     Physical Exam Vitals and nursing note reviewed.  Constitutional:      Appearance: He is well-developed.     Comments: No acute distress  HENT:     Head: Normocephalic and atraumatic.     Nose: Nose normal.  Eyes:     Conjunctiva/sclera: Conjunctivae normal.  Cardiovascular:     Rate and Rhythm: Normal rate.  Pulmonary:     Effort: Pulmonary effort is normal. No respiratory distress.  Abdominal:     General: There is no distension.  Musculoskeletal:        General: Normal range of motion.     Cervical back: Neck supple.     Comments: Left hand: Moderate swelling diffusely throughout dorsum of hand and fingers compared to right, mild warmth, no erythema, mild diffuse tenderness throughout dorsum of hand and distal radius and ulna, slightly limited flexion at DIP and PIP of all 5 fingers on left, radial pulse 2+  Skin:    General: Skin is warm and dry.  Neurological:     Mental Status: He is alert and oriented to person, place, and time.     UC Treatments / Results  Labs (all labs ordered are listed, but only abnormal results are displayed) Labs Reviewed - No data to display  EKG   Radiology DG Hand Complete Left  Result Date: 12/25/2020 CLINICAL DATA:  80 year old male with left hand swelling. EXAM: LEFT HAND - COMPLETE 3+ VIEW COMPARISON:  None. FINDINGS: There is no acute fracture or dislocation. The bones are osteopenic. Mild soft tissue swelling. No radiopaque foreign object or soft tissue gas. IMPRESSION: No acute osseous pathology. Electronically Signed   By: Elgie Collard M.D.   On: 12/25/2020 17:00    Procedures Procedures (including critical care  time)  Medications Ordered in UC Medications - No data to display  Initial Impression / Assessment and Plan / UC Course  I have reviewed the triage vital signs and the nursing notes.  Pertinent labs & imaging results that were available during  my care of the patient were reviewed by me and considered in my medical decision making (see chart for details).     Left hand swelling and pain-no mechanism of injury, but given amount of swelling and persistent symptoms x-ray obtained, no acute bony abnormality, slight osteopenia, no obvious signs of arthritis.  Has been using NSAIDs without relief, will switch to prednisone taper as alternative, last A1c 5.9 and reports blood sugars have been controlled recently.  No signs of cellulitis, suspect most likely underlying inflammatory arthralgia.  Neurovascularly intact.  Discussed strict return precautions. Patient verbalized understanding and is agreeable with plan.  Final Clinical Impressions(s) / UC Diagnoses   Final diagnoses:  Swelling of left hand  Left hand pain     Discharge Instructions      Begin prednisone taper x6 days-begin with 6 tablets on day 1, decrease by 1 tablet each day until complete-6, 5, 4, 3, 2, 1-take with food and earlier in the day if possible Please be sure to take diabetes medicine, drink plenty of water and avoid extra sugars in diet while on this medicine as it will cause blood sugars to increase temporarily Continue to elevate and ice hand Follow-up if not improving or worsening    ED Prescriptions     Medication Sig Dispense Auth. Provider   predniSONE (DELTASONE) 10 MG tablet Begin with 6 tabs on day 1, 5 tab on day 2, 4 tab on day 3, 3 tab on day 4, 2 tab on day 5, 1 tab on day 6-take with food 21 tablet Najmah Carradine C, PA-C      PDMP not reviewed this encounter.   Lew Dawes, [place] 12/25/20 1753

## 2020-12-25 NOTE — Discharge Instructions (Addendum)
Begin prednisone taper x6 days-begin with 6 tablets on day 1, decrease by 1 tablet each day until complete-6, 5, 4, 3, 2, 1-take with food and earlier in the day if possible Please be sure to take diabetes medicine, drink plenty of water and avoid extra sugars in diet while on this medicine as it will cause blood sugars to increase temporarily Continue to elevate and ice hand Follow-up if not improving or worsening

## 2020-12-25 NOTE — ED Triage Notes (Signed)
Pt here for left hand pain x 1 week with swelling; pt denies injury

## 2021-06-28 ENCOUNTER — Emergency Department (HOSPITAL_COMMUNITY): Payer: Medicare (Managed Care)

## 2021-06-28 ENCOUNTER — Encounter (HOSPITAL_COMMUNITY): Payer: Self-pay | Admitting: *Deleted

## 2021-06-28 ENCOUNTER — Other Ambulatory Visit: Payer: Self-pay

## 2021-06-28 ENCOUNTER — Emergency Department (HOSPITAL_COMMUNITY)
Admission: EM | Admit: 2021-06-28 | Discharge: 2021-06-28 | Disposition: A | Discharge status: Home / Self Care | Payer: Medicare (Managed Care) | Attending: Emergency Medicine | Admitting: Emergency Medicine

## 2021-06-28 DIAGNOSIS — I10 Essential (primary) hypertension: Secondary | ICD-10-CM | POA: Insufficient documentation

## 2021-06-28 DIAGNOSIS — Z7984 Long term (current) use of oral hypoglycemic drugs: Secondary | ICD-10-CM | POA: Diagnosis not present

## 2021-06-28 DIAGNOSIS — R2232 Localized swelling, mass and lump, left upper limb: Secondary | ICD-10-CM | POA: Insufficient documentation

## 2021-06-28 DIAGNOSIS — E119 Type 2 diabetes mellitus without complications: Secondary | ICD-10-CM | POA: Diagnosis not present

## 2021-06-28 DIAGNOSIS — Z79899 Other long term (current) drug therapy: Secondary | ICD-10-CM | POA: Insufficient documentation

## 2021-06-28 DIAGNOSIS — M79642 Pain in left hand: Secondary | ICD-10-CM

## 2021-06-28 LAB — CBC WITH DIFFERENTIAL/PLATELET
Abs Immature Granulocytes: 0.02 10*3/uL (ref 0.00–0.07)
Basophils Absolute: 0 10*3/uL (ref 0.0–0.1)
Basophils Relative: 0 %
Eosinophils Absolute: 0 10*3/uL (ref 0.0–0.5)
Eosinophils Relative: 0 %
HCT: 42.9 % (ref 39.0–52.0)
Hemoglobin: 13.8 g/dL (ref 13.0–17.0)
Immature Granulocytes: 0 %
Lymphocytes Relative: 8 %
Lymphs Abs: 0.6 10*3/uL — ABNORMAL LOW (ref 0.7–4.0)
MCH: 27.1 pg (ref 26.0–34.0)
MCHC: 32.2 g/dL (ref 30.0–36.0)
MCV: 84.1 fL (ref 80.0–100.0)
Monocytes Absolute: 0.8 10*3/uL (ref 0.1–1.0)
Monocytes Relative: 10 %
Neutro Abs: 6.2 10*3/uL (ref 1.7–7.7)
Neutrophils Relative %: 82 %
Platelets: 192 10*3/uL (ref 150–400)
RBC: 5.1 MIL/uL (ref 4.22–5.81)
RDW: 13.3 % (ref 11.5–15.5)
WBC: 7.6 10*3/uL (ref 4.0–10.5)
nRBC: 0 % (ref 0.0–0.2)

## 2021-06-28 LAB — BASIC METABOLIC PANEL
Anion gap: 7 (ref 5–15)
BUN: 12 mg/dL (ref 8–23)
CO2: 28 mmol/L (ref 22–32)
Calcium: 9.2 mg/dL (ref 8.9–10.3)
Chloride: 103 mmol/L (ref 98–111)
Creatinine, Ser: 1.01 mg/dL (ref 0.61–1.24)
GFR, Estimated: >60 mL/min (ref >60)
Glucose, Bld: 197 mg/dL — ABNORMAL HIGH (ref 70–99)
Potassium: 4.2 mmol/L (ref 3.5–5.1)
Sodium: 138 mmol/L (ref 135–145)

## 2021-06-28 LAB — SEDIMENTATION RATE: Sed Rate: 8 mm/hr (ref 0–16)

## 2021-06-28 LAB — C-REACTIVE PROTEIN: CRP: 0.9 mg/dL (ref <1.0)

## 2021-06-28 LAB — URIC ACID: Uric Acid, Serum: 5.1 mg/dL (ref 3.7–8.6)

## 2021-06-28 MED ORDER — METHYLPREDNISOLONE 4 MG PO TBPK: ORAL_TABLET | ORAL | 0 refills | Status: DC

## 2021-06-28 MED ORDER — OXYCODONE-ACETAMINOPHEN 5-325 MG PO TABS
1.0000 | ORAL_TABLET | Freq: Once | ORAL | Status: AC
  Administered 2021-06-28: 05:00:00 1 via ORAL
  Filled 2021-06-28: qty 1

## 2021-06-28 MED ORDER — AMLODIPINE BESYLATE 5 MG PO TABS
5.0000 mg | ORAL_TABLET | Freq: Once | ORAL | Status: AC
  Administered 2021-06-28: 09:00:00 5 mg via ORAL
  Filled 2021-06-28: qty 1

## 2021-06-28 NOTE — ED Provider Notes (Signed)
[facility] EMERGENCY DEPARTMENT Provider Note   CSN: [id number] Arrival date & time: 06/28/21  0408     History Chief Complaint  Patient presents with   Hand Pain    Gabriel Harrison is a 80 y.o. male.  Patient with history of arthritis, hypertension, diabetes presents today with complaint of left hand pain.  He states that his symptoms began yesterday around 5 PM when he was placing flowers on his friend's grave.  States that he noticed that his hand was swollen and painful and he was unable to close it due to swelling and pain. Denies any notable injury. No history of gout. No fevers, chills, chest pain, shortness of breath.  The history is provided by the patient. No language interpreter was used.  Hand Pain Pertinent negatives include no chest pain, no abdominal pain and no shortness of breath.      Past Medical History:  Diagnosis Date   Arthritis    Diabetes mellitus without complication ([place])    Hypertension     Patient Active Problem List   Diagnosis Date Noted   Inguinal hernia 02/28/2020    Past Surgical History:  Procedure Laterality Date   COLONOSCOPY     XI ROBOTIC ASSISTED INGUINAL HERNIA REPAIR WITH MESH Right 03/29/2020   Procedure: XI ROBOTIC  INGUINAL HERNIA REPAIR WITH MESH;  Surgeon: Ralene Ok, MD;  Location: [place];  Service: General;  Laterality: Right;       No family history on file.  Social History   Tobacco Use   Smoking status: Never   Smokeless tobacco: Never  Substance Use Topics   Alcohol use: No   Drug use: No    Home Medications Prior to Admission medications   Medication Sig Start Date End Date Taking? Authorizing Provider  acetaminophen (TYLENOL) 325 MG tablet Take 325-650 mg by mouth every 6 (six) hours as needed for mild pain.     [provider]  amLODipine (NORVASC) 5 MG tablet Take 5 mg by mouth daily.  12/12/14   [provider]  benazepril (LOTENSIN) 40 MG tablet Take 40 mg by  mouth 2 (two) times daily.  08/16/14   [provider]  glipiZIDE (GLUCOTROL XL) 5 MG 24 hr tablet Take 5 mg by mouth daily. 01/04/20   [provider]  metFORMIN (GLUCOPHAGE) 1000 MG tablet Take 1,000-1,500 mg by mouth See admin instructions. Take 1.5 tablets (1500 mg) by mouth in the morning & take 1 tablet (1000 mg) by mouth in the evening. 05/19/14   [provider]  predniSONE (DELTASONE) 10 MG tablet Begin with 6 tabs on day 1, 5 tab on day 2, 4 tab on day 3, 3 tab on day 4, 2 tab on day 5, 1 tab on day 6-take with food 12/25/20   Wieters, Hallie C, PA-C  simvastatin (ZOCOR) 20 MG tablet Take 20 mg by mouth daily.  09/13/14   [provider]  tamsulosin (FLOMAX) 0.4 MG CAPS capsule Take 0.4 mg by mouth daily.  02/17/14   [provider]  traMADol (ULTRAM) 50 MG tablet Take 1/2 to 1 tablet TID Patient taking differently: Take 25-50 mg by mouth 3 (three) times daily as needed (pain.).  03/02/16   Tereasa Coop, PA-C    Allergies    Amoxicillin-pot clavulanate  Review of Systems   Review of Systems  Constitutional:  Negative for chills and fever.  Respiratory:  Negative for cough and shortness of breath.   Cardiovascular:  Negative for chest pain.  Gastrointestinal:  Negative for abdominal pain, diarrhea, nausea and vomiting.  Musculoskeletal:  Positive for arthralgias, joint swelling and myalgias. Negative for back pain, gait problem, neck pain and neck stiffness.  Skin:  Negative for color change, pallor, rash and wound.  Psychiatric/Behavioral:  Negative for confusion and decreased concentration.   All other systems reviewed and are negative.  Physical Exam Updated Vital Signs BP (!) 207/90 (BP Location: Left Arm)    Pulse 75    Temp 98.2 F (36.8 C) (Oral)    Resp 20    Ht _0  (1.753 m)    Wt 81.6 kg    SpO2 98%    BMI 26.58 kg/m   Physical Exam Vitals and nursing note reviewed.  Constitutional:      Appearance: Normal appearance. He  is normal weight.     Comments: Patient laying comfortably in bed in no distress  HENT:     Head: Normocephalic and atraumatic.  Eyes:     Extraocular Movements: Extraocular movements intact.  Cardiovascular:     Rate and Rhythm: Normal rate and regular rhythm.     Heart sounds: Normal heart sounds.  Pulmonary:     Effort: Pulmonary effort is normal. No respiratory distress.     Breath sounds: Normal breath sounds. No wheezing or rales.  Abdominal:     General: Abdomen is flat.     Palpations: Abdomen is soft.  Musculoskeletal:     Cervical back: Normal range of motion.     Comments:  Left hand is warm and mildly swollen, tenderness noted to left palm and wrist. ROM of wrist and hand decreased 2/2 pain. Distal sensation intact. Normal cap refill  Skin:    General: Skin is warm and dry.     Capillary Refill: Capillary refill takes less than 2 seconds.  Neurological:     General: No focal deficit present.     Mental Status: He is alert.  Psychiatric:        Mood and Affect: Mood normal.        Behavior: Behavior normal.    ED Results / Procedures / Treatments   Labs (all labs ordered are listed, but only abnormal results are displayed) Labs Reviewed  CBC WITH DIFFERENTIAL/PLATELET - Abnormal; Notable for the following components:      Result Value   Lymphs Abs 0.6 (*)    All other components within normal limits  BASIC METABOLIC PANEL - Abnormal; Notable for the following components:   Glucose, Bld 197 (*)    All other components within normal limits  URIC ACID  SEDIMENTATION RATE  C-REACTIVE PROTEIN    EKG None  Radiology DG Hand Complete Left  Result Date: 06/28/2021 CLINICAL DATA:  Left hand pain and stiffness. EXAM: LEFT HAND - COMPLETE 3+ VIEW COMPARISON:  No comparison studies available. FINDINGS: No fracture. No subluxation or dislocation. Mild degenerative changes noted in scattered IP joints. No worrisome lytic or sclerotic osseous abnormality. IMPRESSION:  Mild degenerative changes without acute bony abnormality. Electronically Signed   By: Misty Stanley M.D.   On: 06/28/2021 05:23    Procedures Procedures   Medications Ordered in ED Medications  oxyCODONE-acetaminophen (PERCOCET/ROXICET) 5-325 MG per tablet 1 tablet (1 tablet Oral Given 06/28/21 0446)    ED Course  I have reviewed the triage vital signs and the nursing notes.  Pertinent labs & imaging results that were available during my care of the patient were reviewed by me and  considered in my medical decision making (see chart for details).    MDM Rules/Calculators/A&P                         Patient presents today with left hand pain onset yesterday.  Swelling and warmth noted without erythema.  Distal sensation and cap refill intact.  Patient diffusely tender over the surface of the palm.  No clear individual joint pain noted. He is afebrile, non-toxic appearing, and in no acute distress.  Labs without evidence of leukocytosis or anemia.  No electrolyte abnormalities.  ESR, CRP, and uric acid all normal.  X-ray of hand reveals mild degenerative changes without acute bony abnormalities.  Given no clear specific joint involvement and the presence of normal ESR CRP and uric acid, low suspicion of gout or septic arthritis at this time.  Patient denies any history of similar, however upon chart review it does appear that he was seen at urgent care for similar symptoms in June of this year.  Was diagnosed with inflammatory arthralgias and given steroids.  Work-up today seems extremely similar to then, therefore recommend similar management.  After bracing, patient endorses improvement in symptoms.  Will recommend Medrol Dosepak with rest and ice for further management.  Discussed this with patient who is amenable with plan.  Educated on red flag symptoms that would prompt immediate return.  Discharged in stable condition.   This is a shared visit with supervising physician Dr. Johnney Killian who  has independently evaluated patient & provided guidance in evaluation/management/disposition, in agreement with care     Final Clinical Impression(s) / ED Diagnoses Final diagnoses:  Left hand pain    Rx / DC Orders ED Discharge Orders          Ordered    methylPREDNISolone (MEDROL DOSEPAK) 4 MG TBPK tablet        06/28/21 1735          An After Visit Summary was printed and given to the patient.    Bud Face, PA-C 06/28/21 1000    Charlesetta Shanks, MD 07/03/21 [phone]

## 2021-06-28 NOTE — ED Triage Notes (Signed)
Patient presents to ed via [facility] states his left hand started hurting approx. 5pm states she was putting some flowers on a friends grave and wasn't sure if he pulled a muscle.

## 2021-06-28 NOTE — ED Notes (Signed)
Ortho Tech at bedside.  

## 2021-06-28 NOTE — ED Provider Notes (Signed)
I provided a substantive portion of the care of this patient.  I personally performed the entirety of the medical decision making for this encounter.    Patient developed pain in his hand around the base of the thumb and wrist yesterday.  He did not have any pain before going to a cemetery and working to change out flowers in an urn.  After that he noticed stiffness in closing his hand and pain around the base of the thumb.  Patient is alert nontoxic.  Mental status clear.  Mild to moderate swelling around the thenar eminence and wrist.  No erythema.  Not warm to touch.  Is uncomfortable with deep palpation around the base of the thumb at the metacarpal phalangeal joint.  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  No erythema.  At this time I have fairly low suspicion for infectious etiology.  Plan will be for splinting icing elevating and pain control.  I agree with plan of management.   Arby Barrette, MD 06/28/21 [phone]

## 2021-06-28 NOTE — ED Notes (Signed)
ED Provider at bedside. 

## 2021-06-28 NOTE — Progress Notes (Signed)
Orthopedic Tech Progress Note Patient Details:  Gabriel Harrison 24-Apr-1941 [id number]  Ortho Devices Type of Ortho Device: Thumb velcro splint Ortho Device/Splint Location: left Ortho Device/Splint Interventions: Ordered, Application, Adjustment   Post Interventions Patient Tolerated: Well Instructions Provided: Adjustment of device  [name] [name] 06/28/2021, 10:00 AM Applied left wrist brace with thumb support.

## 2021-06-28 NOTE — Discharge Instructions (Addendum)
Your work-up in the ER today was reassuring for acute findings. Feel that your symptoms are likely due to inflammation related to arthritis. Please manage your symptoms with steroid medication that I have prescribed for you. Also, please wear your brace and keep your hand elevated and apply ice as you are able. Follow-up with your primary care in the next few days for re-evaluation.  Return if development of any new or worsening symptoms.

## 2021-06-28 NOTE — ED Provider Notes (Signed)
Emergency Medicine Provider Triage Evaluation Note  Gabriel Harrison , a 80 y.o. male  was evaluated in triage.  Pt complains of left hand pain.  Onset yesterday.  No hx of gout.  Worsened with movement and palpation.  No trauma.  Review of Systems  Positive: Hand pain Negative: Fever, chills  Physical Exam  BP (!) 211/88 (BP Location: Right Arm)    Pulse 87    Temp 98.6 F (37 C) (Oral)    Resp 17    SpO2 99%  Gen:   Awake, no distress   Resp:  Normal effort  MSK:   Moves extremities without difficulty  Other:  Left hand is warm and mildly swollen, ROM decreased 2/2 pain  Medical Decision Making  Medically screening exam initiated at 4:21 AM.  Appropriate orders placed.  Gabriel Harrison was informed that the remainder of the evaluation will be completed by another provider, this initial triage assessment does not replace that evaluation, and the importance of remaining in the ED until their evaluation is complete.  ?gout   Roxy Horseman, Cordelia Poche 06/28/21 Worthy Flank, MD 06/28/21 [phone]

## 2021-10-04 ENCOUNTER — Ambulatory Visit
Admission: RE | Admit: 2021-10-04 | Discharge: 2021-10-04 | Disposition: A | Discharge status: Home / Self Care | Payer: Medicare (Managed Care) | Source: Ambulatory Visit | Attending: Internal Medicine | Admitting: Internal Medicine

## 2021-10-04 ENCOUNTER — Ambulatory Visit (INDEPENDENT_AMBULATORY_CARE_PROVIDER_SITE_OTHER): Payer: Medicare (Managed Care)

## 2021-10-04 VITALS — BP 114/67 | HR 91 | Temp 97.4°F | Resp 20

## 2021-10-04 DIAGNOSIS — M79641 Pain in right hand: Secondary | ICD-10-CM

## 2021-10-04 MED ORDER — PREDNISONE 10 MG (21) PO TBPK: ORAL_TABLET | Freq: Every day | ORAL | 0 refills | Status: DC

## 2021-10-04 NOTE — ED Triage Notes (Signed)
Patient c/o right hand pain/swelling x 1 week.  This happened about a year ago and just reoccurred.  Patient has taken Tylenol for the pain. ?

## 2021-10-04 NOTE — ED Provider Notes (Signed)
?[facility] ? ? ? ?CSN: [id number] ?Arrival date & time: 10/04/21  1333 ? ? ?  ? ?History   ?Chief Complaint ?Chief Complaint  ?Patient presents with  ? Hand Problem  ?  Entered by patient  ? ? ?HPI ?Gabriel Harrison is a 81 y.o. male.  ? ?Patient presents with right hand pain and swelling that has been present for approximately 1 week.  Patient reports history of the same with chronic arthritis in the hand.  Denies any apparent injury.  Patient does report that multiple years ago his hand was crushed in a machine at work.  Patient has taken Tylenol for the pain with minimal improvement.  He has taken prednisone in the past for same pain with resolution of symptoms.  Denies any numbness or tingling. ? ? ? ?Past Medical History:  ?Diagnosis Date  ? Arthritis   ? Diabetes mellitus without complication ([facility])   ? Hypertension   ? ? ?Patient Active Problem List  ? Diagnosis Date Noted  ? Inguinal hernia 02/28/2020  ? ? ?Past Surgical History:  ?Procedure Laterality Date  ? COLONOSCOPY    ? XI ROBOTIC ASSISTED INGUINAL HERNIA REPAIR WITH MESH Right 03/29/2020  ? Procedure: XI ROBOTIC  INGUINAL HERNIA REPAIR WITH MESH;  Surgeon: Axel Filler, MD;  Location: [facility] OR;  Service: General;  Laterality: Right;  ? ? ? ? ? ?Home Medications   ? ?Prior to Admission medications   ?Medication Sig Start Date End Date Taking? Authorizing Provider  ?acetaminophen (TYLENOL) 325 MG tablet Take 325-650 mg by mouth every 6 (six) hours as needed for mild pain.    Yes [provider]  ?amLODipine (NORVASC) 5 MG tablet Take 5 mg by mouth daily.  12/12/14  Yes [provider]  ?benazepril (LOTENSIN) 40 MG tablet Take 40 mg by mouth 2 (two) times daily.  08/16/14  Yes [provider]  ?glipiZIDE (GLUCOTROL XL) 5 MG 24 hr tablet Take 5 mg by mouth daily. 01/04/20  Yes [provider]  ?metFORMIN (GLUCOPHAGE) 1000 MG tablet Take 1,000-1,500 mg by mouth See admin instructions. Take 1.5 tablets (1500 mg) by  mouth in the morning & take 1 tablet (1000 mg) by mouth in the evening. 05/19/14  Yes [provider]  ?predniSONE (STERAPRED UNI-PAK 21 TAB) 10 MG (21) TBPK tablet Take by mouth daily. Take 6 tabs by mouth daily  for 2 days, then 5 tabs for 2 days, then 4 tabs for 2 days, then 3 tabs for 2 days, 2 tabs for 2 days, then 1 tab by mouth daily for 2 days 10/04/21  Yes Gustavus Bryant, FNP  ?simvastatin (ZOCOR) 20 MG tablet Take 20 mg by mouth daily.  09/13/14  Yes [provider]  ?tamsulosin (FLOMAX) 0.4 MG CAPS capsule Take 0.4 mg by mouth daily.  02/17/14  Yes [provider]  ?traMADol (ULTRAM) 50 MG tablet Take 1/2 to 1 tablet TID ?Patient taking differently: Take 25-50 mg by mouth 3 (three) times daily as needed (pain.). 03/02/16  Yes Ofilia Neas, PA-C  ? ? ?Family History ?History reviewed. No pertinent family history. ? ?Social History ?Social History  ? ?Tobacco Use  ? Smoking status: Never  ? Smokeless tobacco: Never  ?Substance Use Topics  ? Alcohol use: No  ? Drug use: No  ? ? ? ?Allergies   ?Amoxicillin-pot clavulanate ? ? ?Review of Systems ?Review of Systems ?Per HPI ? ?Physical Exam ?Triage Vital Signs ?ED Triage Vitals  ?Enc  Vitals Group  ?   BP 10/04/21 1353 114/67  ?   Pulse Rate 10/04/21 1353 91  ?   Resp 10/04/21 1353 20  ?   Temp 10/04/21 1353 (!) 97.4 ?F (36.3 ?C)  ?   Temp Source 10/04/21 1353 Oral  ?   SpO2 10/04/21 1353 98 %  ?   Weight --   ?   Height --   ?   Head Circumference --   ?   Peak Flow --   ?   Pain Score 10/04/21 1358 8  ?   Pain Loc --   ?   Pain Edu? --   ?   Excl. in [place]? --   ? ?No data found. ? ?Updated Vital Signs ?BP 114/67 (BP Location: Left Arm)   Pulse 91   Temp (!) 97.4 ?F (36.3 ?C) (Oral)   Resp 20   SpO2 98%  ? ?Visual Acuity ?Right Eye Distance:   ?Left Eye Distance:   ?Bilateral Distance:   ? ?Right Eye Near:   ?Left Eye Near:    ?Bilateral Near:    ? ?Physical Exam ?Constitutional:   ?   General: He is not in acute distress. ?    Appearance: Normal appearance. He is not toxic-appearing or diaphoretic.  ?HENT:  ?   Head: Normocephalic and atraumatic.  ?Eyes:  ?   Extraocular Movements: Extraocular movements intact.  ?   Conjunctiva/sclera: Conjunctivae normal.  ?Pulmonary:  ?   Effort: Pulmonary effort is normal.  ?Musculoskeletal:  ?   Right hand: Swelling, tenderness and bony tenderness present. Normal range of motion. Normal strength. Normal sensation. There is no disruption of two-point discrimination. Normal capillary refill. Normal pulse.  ?   Left hand: Normal.  ?   Comments: Tenderness to palpation to dorsal surface of right hand directly above wrist with associated mild swelling and erythema.  No warmth to touch.  Patient has full range of motion of hand and wrist.  Neurovascular intact.  ?Neurological:  ?   General: No focal deficit present.  ?   Mental Status: He is alert and oriented to person, place, and time. Mental status is at baseline.  ?Psychiatric:     ?   Mood and Affect: Mood normal.     ?   Behavior: Behavior normal.     ?   Thought Content: Thought content normal.     ?   Judgment: Judgment normal.  ? ? ? ?UC Treatments / Results  ?Labs ?(all labs ordered are listed, but only abnormal results are displayed) ?Labs Reviewed - No data to display ? ?EKG ? ? ?Radiology ?DG Hand Complete Right ? ?Result Date: 10/04/2021 ?CLINICAL DATA:  Hand pain.  Swelling. EXAM: RIGHT HAND - COMPLETE 3+ VIEW COMPARISON:  None. FINDINGS: Mild diffuse osteopenia. No fracture or dislocation. No radiopaque foreign bodies or soft tissue calcifications. Mild degenerative changes noted at the basilar joint. IMPRESSION: 1. No acute findings. 2. Osteopenia. Electronically Signed   By: Kerby Moors M.D.   On: 10/04/2021 14:25   ? ?Procedures ?Procedures (including critical care time) ? ?Medications Ordered in UC ?Medications - No data to display ? ?Initial Impression / Assessment and Plan / UC Course  ?I have reviewed the triage vital signs and the  nursing notes. ? ?Pertinent labs & imaging results that were available during my care of the patient were reviewed by me and considered in my medical decision making (see chart for details). ? ?  ? ?  Patient reports that the pain in the right hand is the same pain as he has been evaluated for in the past but further review of the chart reveals that it was his left hand.  Therefore, x-ray was obtained.  It did show degenerative changes but no obvious acute bony abnormality.  There is low concern for septic arthritis or gout.  Will prescribe prednisone steroid to decrease inflammation as patient has seen success with this treatment in the past.  Patient reports his blood sugars typically tolerate this well and he monitors them every day.  Discussed return precautions.  Patient was provided with hand specialty contact information to follow-up with for further evaluation and management given chronicity of issue.  Patient verbalized understanding and was agreeable with plan. ?Final Clinical Impressions(s) / UC Diagnoses  ? ?Final diagnoses:  ?Right hand pain  ? ? ? ?Discharge Instructions   ? ?  ?You have been prescribed a medication to help alleviate the inflammation in your hand.  Please follow-up if symptoms persist or worsen. ? ? ? ? ?ED Prescriptions   ? ? Medication Sig Dispense Auth. Provider  ? predniSONE (STERAPRED UNI-PAK 21 TAB) 10 MG (21) TBPK tablet Take by mouth daily. Take 6 tabs by mouth daily  for 2 days, then 5 tabs for 2 days, then 4 tabs for 2 days, then 3 tabs for 2 days, 2 tabs for 2 days, then 1 tab by mouth daily for 2 days 42 tablet [place], Michele Rockers, [place]  ? ?  ? ?PDMP not reviewed this encounter. ?  ?Teodora Medici, [place] ?10/04/21 1437 ? ?

## 2021-10-04 NOTE — Discharge Instructions (Signed)
You have been prescribed a medication to help alleviate the inflammation in your hand.  Please follow-up if symptoms persist or worsen. ?

## 2023-06-25 ENCOUNTER — Ambulatory Visit (INDEPENDENT_AMBULATORY_CARE_PROVIDER_SITE_OTHER): Payer: Medicare Other

## 2023-06-25 ENCOUNTER — Ambulatory Visit
Admission: EM | Admit: 2023-06-25 | Discharge: 2023-06-25 | Disposition: A | Discharge status: Home / Self Care | Payer: Medicare Other | Attending: Internal Medicine | Admitting: Internal Medicine

## 2023-06-25 ENCOUNTER — Other Ambulatory Visit: Payer: Self-pay

## 2023-06-25 ENCOUNTER — Encounter: Payer: Self-pay | Admitting: *Deleted

## 2023-06-25 DIAGNOSIS — M25532 Pain in left wrist: Secondary | ICD-10-CM | POA: Diagnosis not present

## 2023-06-25 MED ORDER — PREDNISONE 20 MG PO TABS: 40.0000 mg | ORAL_TABLET | Freq: Every day | ORAL | 0 refills | Status: AC

## 2023-06-25 NOTE — ED Provider Notes (Signed)
[facility]    CSN: [id number] Arrival date & time: 06/25/23  1235      History   Chief Complaint Chief Complaint  Patient presents with   Arm Pain    HPI Gabriel Harrison is a 82 y.o. male.   The history is provided by the patient.  Arm Pain  Left wrist onset today, states earlier had pain from his shoulder down, left hand is swollen had difficulty gripping or closing the hand.  Denies injury, weakness, paresthesias, headache, change in vision, chest pain, shortness of breath, abdominal pain, nausea.  Has history of similar symptoms.  Past Medical History:  Diagnosis Date   Arthritis    Diabetes mellitus without complication (HCC)    Hypertension     Patient Active Problem List   Diagnosis Date Noted   Inguinal hernia 02/28/2020    Past Surgical History:  Procedure Laterality Date   COLONOSCOPY     XI ROBOTIC ASSISTED INGUINAL HERNIA REPAIR WITH MESH Right 03/29/2020   Procedure: XI ROBOTIC  INGUINAL HERNIA REPAIR WITH MESH;  Surgeon: Axel Filler, MD;  Location: [facility] OR;  Service: General;  Laterality: Right;       Home Medications    Prior to Admission medications   Medication Sig Start Date End Date Taking? Authorizing Provider  amLODipine (NORVASC) 5 MG tablet Take 5 mg by mouth daily.  12/12/14  Yes [provider]  benazepril (LOTENSIN) 40 MG tablet Take 40 mg by mouth 2 (two) times daily.  08/16/14  Yes [provider]  glipiZIDE (GLUCOTROL XL) 5 MG 24 hr tablet Take 5 mg by mouth daily. 01/04/20  Yes [provider]  metFORMIN (GLUCOPHAGE) 1000 MG tablet Take 1,000-1,500 mg by mouth See admin instructions. Take 1.5 tablets (1500 mg) by mouth in the morning & take 1 tablet (1000 mg) by mouth in the evening. 05/19/14  Yes [provider]  simvastatin (ZOCOR) 20 MG tablet Take 20 mg by mouth daily.  09/13/14  Yes [provider]  tamsulosin (FLOMAX) 0.4 MG CAPS capsule Take 0.4 mg by mouth daily.  02/17/14   Yes [provider]  acetaminophen (TYLENOL) 325 MG tablet Take 325-650 mg by mouth every 6 (six) hours as needed for mild pain.     [provider]  predniSONE (STERAPRED UNI-PAK 21 TAB) 10 MG (21) TBPK tablet Take by mouth daily. Take 6 tabs by mouth daily  for 2 days, then 5 tabs for 2 days, then 4 tabs for 2 days, then 3 tabs for 2 days, 2 tabs for 2 days, then 1 tab by mouth daily for 2 days 10/04/21   Gustavus Bryant, FNP  traMADol (ULTRAM) 50 MG tablet Take 1/2 to 1 tablet TID Patient taking differently: Take 25-50 mg by mouth 3 (three) times daily as needed (pain.). 03/02/16   Ofilia Neas, PA-C    Family History History reviewed. No pertinent family history.  Social History Social History   Tobacco Use   Smoking status: Never   Smokeless tobacco: Never  Substance Use Topics   Alcohol use: No   Drug use: No     Allergies   Amoxicillin-pot clavulanate   Review of Systems Review of Systems  Constitutional:  Negative for chills and fever.  Musculoskeletal:  Positive for arthralgias.  Skin:  Positive for color change. Negative for rash and wound.     Physical Exam Triage Vital Signs ED Triage Vitals  Encounter Vitals Group     BP  06/25/23 1348 (!) 154/74     Systolic BP Percentile --      Diastolic BP Percentile --      Pulse Rate 06/25/23 1348 66     Resp 06/25/23 1348 18     Temp 06/25/23 1348 97.9 F (36.6 C)     Temp Source 06/25/23 1348 Oral     SpO2 06/25/23 1348 97 %     Weight --      Height --      Head Circumference --      Peak Flow --      Pain Score 06/25/23 1350 5     Pain Loc --      Pain Education --      Exclude from Growth Chart --    No data found.  Updated Vital Signs BP (!) 154/74 (BP Location: Left Arm)   Pulse 66   Temp 97.9 F (36.6 C) (Oral)   Resp 18   SpO2 97%   Visual Acuity Right Eye Distance:   Left Eye Distance:   Bilateral Distance:    Right Eye Near:   Left Eye Near:    Bilateral Near:      Physical Exam Constitutional:      Appearance: Normal appearance.  Cardiovascular:     Rate and Rhythm: Normal rate.  Pulmonary:     Effort: Pulmonary effort is normal.     Breath sounds: Normal breath sounds.  Musculoskeletal:     Comments: Left wrist swelling, erythema, warmth, dorsal surface, limited range of motion secondary to pain, diffuse swelling left hand all digits, chronic appearing bony deformities consistent with arthritis.  Left elbow full passive range of motion without swelling or tenderness, left shoulder good range of motion nontender Good radial pulse  Neurological:     Mental Status: He is alert.      UC Treatments / Results  Labs (all labs ordered are listed, but only abnormal results are displayed) Labs Reviewed - No data to display  EKG   Radiology No results found.  Procedures Procedures (including critical care time)  Medications Ordered in UC Medications - No data to display  Initial Impression / Assessment and Plan / UC Course  I have reviewed the triage vital signs and the nursing notes.  Pertinent labs & imaging results that were available during my care of the patient were reviewed by me and considered in my medical decision making (see chart for details).     82 year old male with left wrist swelling, warmth and redness x 1 day no injury no fever.  Left wrist x-ray independently viewed by me no fracture, degenerative changesredness on few days of prednisone recommend he follow-up with his primary care provider Final Clinical Impressions(s) / UC Diagnoses   Final diagnoses:  Acute pain of left wrist   Discharge Instructions   None    ED Prescriptions   None    PDMP not reviewed this encounter.   Meliton Rattan, [place] 06/25/23 1438

## 2023-06-25 NOTE — Discharge Instructions (Addendum)
Follow-up with your doctor if no improvement in symptoms  Go to the emergency department if you develop fever  Rest and elevate your left wrist  The x-ray reading we discussed is preliminary. Your x-ray will be read by a radiologist in next few hours. If there is a discrepancy, you will be contacted, and instructed on a new plan for you care.

## 2023-06-25 NOTE — ED Triage Notes (Signed)
Pt reports pain in left shoulder radiating to right hand x 1 day. States "I could barely close the hand". Denies injury. Strong radial pulse present, hand warm cap refill <3 sec

## 2023-07-07 ENCOUNTER — Telehealth: Payer: Self-pay

## 2023-07-07 NOTE — Telephone Encounter (Signed)
Patient calling UC spoke with Patient Access:  MRN [id number] Gabriel Harrison is inquiring about a refill and not sure if he should come in again or not. [phone]....  Returned call to patient, No answer. No fully identified VM. Will await return call and/or retry.  Yancey Flemings CMA

## 2023-08-25 ENCOUNTER — Encounter: Payer: Self-pay | Admitting: Emergency Medicine

## 2023-08-25 ENCOUNTER — Ambulatory Visit
Admission: EM | Admit: 2023-08-25 | Discharge: 2023-08-25 | Disposition: A | Discharge status: Home / Self Care | Payer: Medicare Other

## 2023-08-25 DIAGNOSIS — T148XXA Other injury of unspecified body region, initial encounter: Secondary | ICD-10-CM

## 2023-08-25 DIAGNOSIS — S4991XA Unspecified injury of right shoulder and upper arm, initial encounter: Secondary | ICD-10-CM

## 2023-08-25 DIAGNOSIS — W19XXXA Unspecified fall, initial encounter: Secondary | ICD-10-CM

## 2023-08-25 MED ORDER — MUPIROCIN 2 % EX OINT: 1.0000 | TOPICAL_OINTMENT | Freq: Two times a day (BID) | CUTANEOUS | 0 refills | Status: AC | PRN

## 2023-08-25 MED ORDER — LIDOCAINE 4 % EX PTCH: 1.0000 | MEDICATED_PATCH | Freq: Every day | CUTANEOUS | 0 refills | Status: AC | PRN

## 2023-08-25 NOTE — ED Provider Notes (Signed)
 [facility]    CSN: [id number] Arrival date & time: 08/25/23  1713      History   Chief Complaint Chief Complaint  Patient presents with   Fall    HPI Gabriel Harrison is a 83 y.o. male.  Patient here today for evaluation of an abrasion on the right side of his nose, right soreness of the shoulder which is chronic, and abrasion on his scalp after sustaining a fall 4 days ago.  Patient suffers from generalized weakness of his lower extremities and typically walks with a cane.  He reports that he was getting out of the car to go into the convenient store and did not have his cane and lost his footing, subsequently falling forward.  He did not lose consciousness.  He was not evaluated after the fall.  He denies any headache, dizziness or visual acuity changes.  Dors is that he is having some shoulder pain however reports this is been ongoing for a while and he feels that the fall likely exacerbated the shoulder pain.  He has limited range of motion with rotation.  He has not seen an orthopedic provider for this problem.   Past Medical History:  Diagnosis Date   Arthritis    Diabetes mellitus without complication (HCC)    Hypertension     Patient Active Problem List   Diagnosis Date Noted   Inguinal hernia 02/28/2020    Past Surgical History:  Procedure Laterality Date   COLONOSCOPY     XI ROBOTIC ASSISTED INGUINAL HERNIA REPAIR WITH MESH Right 03/29/2020   Procedure: XI ROBOTIC  INGUINAL HERNIA REPAIR WITH MESH;  Surgeon: Axel Filler, MD;  Location: [facility] OR;  Service: General;  Laterality: Right;       Home Medications    Prior to Admission medications   Medication Sig Start Date End Date Taking? Authorizing Provider  acetaminophen (TYLENOL) 325 MG tablet Take 325-650 mg by mouth every 6 (six) hours as needed for mild pain.    Yes [provider]  amLODipine (NORVASC) 5 MG tablet Take 5 mg by mouth daily.  12/12/14  Yes [provider]   benazepril (LOTENSIN) 40 MG tablet Take 40 mg by mouth 2 (two) times daily.  08/16/14  Yes [provider]  Blood Glucose Monitoring Suppl (ONETOUCH VERIO FLEX SYSTEM) w/Device KIT USE AS DIRECTED TO TEST BLOOD GLUCOSE DAILY 05/05/23  Yes [provider]  Cholecalciferol (D 1000) 25 MCG (1000 UT) capsule Take by mouth. 04/30/23  Yes [provider]  cyanocobalamin (VITAMIN B12) 1000 MCG tablet Take by mouth. 05/01/23 04/30/24 Yes [provider]  furosemide (LASIX) 20 MG tablet Take by mouth. 07/19/21  Yes [provider]  glipiZIDE (GLUCOTROL XL) 5 MG 24 hr tablet Take 5 mg by mouth daily. 01/04/20  Yes [provider]  lidocaine (SALONPAS PAIN RELIEVING) 4 % Place 1 patch onto the skin daily as needed. 08/25/23  Yes Bing Neighbors, NP  metFORMIN (GLUCOPHAGE) 1000 MG tablet Take 1,000-1,500 mg by mouth See admin instructions. Take 1.5 tablets (1500 mg) by mouth in the morning & take 1 tablet (1000 mg) by mouth in the evening. 05/19/14  Yes [provider]  mupirocin ointment (BACTROBAN) 2 % Apply 1 Application topically 2 (two) times daily as needed (abrasions on nose). 08/25/23  Yes Bing Neighbors, NP  PRECISION QID TEST test strip 1 each (1 strip total) 3 (three) times daily Use as instructed. 10/11/22 10/11/23 Yes [provider]  simvastatin (ZOCOR) 20 MG tablet Take 20 mg by mouth daily.  09/13/14  Yes [provider]  tamsulosin (FLOMAX) 0.4 MG CAPS capsule Take 0.4 mg by mouth daily.  02/17/14  Yes [provider]  traMADol (ULTRAM) 50 MG tablet Take 1/2 to 1 tablet TID Patient taking differently: Take 25-50 mg by mouth 3 (three) times daily as needed (pain.). 03/02/16  Yes Ofilia Neas, PA-C  Cysteamine Bitartrate (PROCYSBI) 300 MG PACK Use 1 each 3 (three) times daily Use as instructed. Patient not taking: Reported on 08/25/2023 04/29/23 04/28/24  [provider]  predniSONE (DELTASONE) 10 MG  tablet Take 4 tabs (40mg ) daily for 2 days, then 3 tabs (30mg ) daily for 2 days, then 2 tabs (20mg ) daily for 2 days, then 1 tab (10mg ) daily for 2 days, then 1/2 tab (5mg ) daily for 2 days, then stop Patient not taking: Reported on 08/25/2023 07/30/23   [provider]    Family History History reviewed. No pertinent family history.  Social History Social History   Tobacco Use   Smoking status: Never   Smokeless tobacco: Never  Vaping Use   Vaping status: Never Used  Substance Use Topics   Alcohol use: No   Drug use: No     Allergies   Amoxicillin-pot clavulanate   Review of Systems Review of Systems Pertinent negatives listed in HPI   Physical Exam Triage Vital Signs ED Triage Vitals  Encounter Vitals Group     BP 08/25/23 1743 (!) 158/81     Systolic BP Percentile --      Diastolic BP Percentile --      Pulse Rate 08/25/23 1743 85     Resp 08/25/23 1743 18     Temp 08/25/23 1743 97.9 F (36.6 C)     Temp Source 08/25/23 1743 Oral     SpO2 08/25/23 1743 99 %     Weight --      Height --      Head Circumference --      Peak Flow --      Pain Score 08/25/23 1744 4     Pain Loc --      Pain Education --      Exclude from Growth Chart --    No data found.  Updated Vital Signs BP (!) 158/81 (BP Location: Left Arm)   Pulse 85   Temp 97.9 F (36.6 C) (Oral)   Resp 18   SpO2 99%   Visual Acuity Right Eye Distance:   Left Eye Distance:   Bilateral Distance:    Right Eye Near:   Left Eye Near:    Bilateral Near:     Physical Exam Constitutional:      Appearance: Normal appearance.  HENT:     Head: Normocephalic and atraumatic.     Nose: Nose normal. No nasal deformity.  Eyes:     Extraocular Movements: Extraocular movements intact.     Pupils: Pupils are equal, round, and reactive to light.  Cardiovascular:     Rate and Rhythm: Normal rate and regular rhythm.  Musculoskeletal:     Right shoulder: Decreased range of motion. Decreased  strength.     Left shoulder: Normal.     Cervical back: Normal range of motion and neck supple.  Skin:    General: Skin is warm and dry.     Findings: Abrasion (right nose, mid frontal scalp) present.  Neurological:     Mental Status: He is alert. Mental  status is at baseline.  Psychiatric:        Behavior: Behavior is cooperative.     UC Treatments / Results  Labs (all labs ordered are listed, but only abnormal results are displayed) Labs Reviewed - No data to display  EKG   Radiology No results found.  Procedures Procedures (including critical care time)  Medications Ordered in UC Medications - No data to display  Initial Impression / Assessment and Plan / UC Course  I have reviewed the triage vital signs and the nursing notes.  Pertinent labs & imaging results that were available during my care of the patient were reviewed by me and considered in my medical decision making (see chart for details).    Imaging not available today onsite.  Patient encouraged to go to [facility] walk-in clinic given history of chronic shoulder pain and recent injury patient will warrant orthopedic specialty workup.  Patient has no acute neurological deficits present on exam.  Skin abrasions continue to apply mupirocin ointment until wound heals.  Low suspicion for nasal fracture, nose is symmetrical no obvious swelling no pain with palpation.  Return precautions given. Final Clinical Impressions(s) / UC Diagnoses   Final diagnoses:  Fall, initial encounter  Injury of right shoulder, initial encounter  Skin abrasion     Discharge Instructions      Apply lidocaine patch to any shoulder joint or hip joint where you are having pain change patch every 24 hours.  Patches as needed for pain.  For your shoulder pain like for you to follow-up at [facility] to see an orthopedic specialist for further evaluation your limited range of motion.  I prescribed you Bactroban ointment applied to your  nose and patient of your scalp until the wound completely heals.     ED Prescriptions     Medication Sig Dispense Auth. Provider   lidocaine (SALONPAS PAIN RELIEVING) 4 % Place 1 patch onto the skin daily as needed. 30 patch Bing Neighbors, NP   mupirocin ointment (BACTROBAN) 2 % Apply 1 Application topically 2 (two) times daily as needed (abrasions on nose). 30 g Bing Neighbors, NP      PDMP not reviewed this encounter.   Bing Neighbors, NP 08/28/23 1328

## 2023-08-25 NOTE — Discharge Instructions (Addendum)
 Apply lidocaine patch to any shoulder joint or hip joint where you are having pain change patch every 24 hours.  Patches as needed for pain.  For your shoulder pain like for you to follow-up at [facility] to see an orthopedic specialist for further evaluation your limited range of motion.  I prescribed you Bactroban ointment applied to your nose and patient of your scalp until the wound completely heals.

## 2023-08-25 NOTE — ED Triage Notes (Signed)
 Pt reports fall x4 days that occurred when he stumbled over a step at a store. Abrasion to R side of nose and top of head. Soreness reported in R shoulder. Pt not on blood thinners and has not seen anyone else regarding fall. Pt reports bleeding stopped quickly the same day. Pt needs to be seen prior to PCP this thurs for report on fall to submit to insurance. No cognitive deficits obvious in disharge.
